# Patient Record
Sex: Male | Born: 1970 | Race: White | Hispanic: No | Marital: Single
Health system: Midwestern US, Community
[De-identification: ages and names within clinical notes are randomized; demographics above are authoritative.]

## PROBLEM LIST (undated history)

## (undated) DIAGNOSIS — F988 Other specified behavioral and emotional disorders with onset usually occurring in childhood and adolescence: Secondary | ICD-10-CM

## (undated) HISTORY — DX: Other specified behavioral and emotional disorders with onset usually occurring in childhood and adolescence: F98.8

## (undated) HISTORY — PX: KNEE ARTHROSCOPY WITH DRILLING/MICROFRACTURE: SHX6425

## (undated) HISTORY — PX: APPENDECTOMY: SHX54

---

## 2011-12-05 ENCOUNTER — Inpatient Hospital Stay

## 2011-12-05 ENCOUNTER — Ambulatory Visit: Admit: 2011-12-05

## 2011-12-19 ENCOUNTER — Inpatient Hospital Stay: Admit: 2011-12-19

## 2011-12-19 ENCOUNTER — Ambulatory Visit: Admit: 2011-12-19

## 2012-01-13 ENCOUNTER — Inpatient Hospital Stay: Admit: 2012-01-13

## 2012-01-13 ENCOUNTER — Ambulatory Visit: Admit: 2012-01-13

## 2012-01-29 ENCOUNTER — Inpatient Hospital Stay: Admit: 2012-01-31

## 2012-02-29 ENCOUNTER — Inpatient Hospital Stay: Admit: 2012-03-02

## 2012-03-09 ENCOUNTER — Ambulatory Visit

## 2012-03-27 ENCOUNTER — Encounter

## 2012-03-28 ENCOUNTER — Inpatient Hospital Stay: Admit: 2012-03-30

## 2013-02-25 ENCOUNTER — Ambulatory Visit: Admit: 2013-02-25

## 2013-03-16 ENCOUNTER — Encounter: Attending: Nurse Practitioner

## 2013-03-16 ENCOUNTER — Encounter

## 2013-03-16 NOTE — ED Provider Notes (Signed)
Triage Chief Complaint:   Laceration    HOPI:  Peter Cooley is a 43 y.o. male that presents today complaining of hand laceration.  Context is patient was playing volleyball and reached for the volleyball and accidentally hit someone in the tooth.  Call in the tooth did lacerate his knuckle.  Did happened approximately 30 minutes prior to arrival    No.  Tetanus shot is not up-to-date.  Patient ranks pain 1 out of 10.  Nonradiating.  Denies any numbness or tingling of the fingers denies any decreased range of motion.  ROS:  At least 04 systems reviewed and otherwise negative except as in the [place].    Past Medical History   Diagnosis Date   ??? ADD (attention deficit disorder)      Past Surgical History   Procedure Laterality Date   ??? Shoulder arthroscopy     ??? Knee surgery     ??? Appendectomy       History reviewed. No pertinent family history.  History     Social History   ??? Marital Status: Single     Spouse Name: N/A     Number of Children: N/A   ??? Years of Education: N/A     Occupational History   ??? Not on file.     Social History Main Topics   ??? Smoking status: Never Smoker    ??? Smokeless tobacco: Current User     Types: Snuff   ??? Alcohol Use: Yes      Comment: occasionally   ??? Drug Use: No   ??? Sexual Activity:     Partners: Female     Other Topics Concern   ??? Not on file     Social History Narrative     Current Facility-Administered Medications   Medication Dose Route Frequency Provider Last Rate Last Dose   ??? amoxicillin-clavulanate (AUGMENTIN) 875-125 MG per tablet 1 tablet  1 tablet Oral Once Ambrose Pancoast, PA-C         Current Outpatient Prescriptions   Medication Sig Dispense Refill   ??? amphetamine-dextroamphetamine (ADDERALL) 20 MG tablet Take 20 mg by mouth daily.       ??? amoxicillin-clavulanate (AUGMENTIN) 875-125 MG per tablet Take 1 tablet by mouth 2 times daily for 10 days.  20 tablet  0     No Known Allergies    Nursing Notes Reviewed    Physical Exam:  ED Triage Vitals   Enc Vitals Group       BP 03/16/13 2155 155/85 mmHg      Pulse 03/16/13 2155 83      Resp 03/16/13 2155 16      Temp 03/16/13 2155 98.4 ??F (36.9 ??C)      Temp Source 03/16/13 2155 Oral      SpO2 03/16/13 2155 100 %      Weight 03/16/13 2155 205 lb (92.987 kg)      Height 03/16/13 2155 6' 5"  (1.956 m)      Head Cir --       Peak Flow --       Pain Score --       Pain Loc --       Pain Edu? --       Excl. in [place]? --      GENERAL APPEARANCE: Awake and alert. Cooperative. No acute distress.  HEAD: Normocephalic. Atraumatic.  MUSCULOSKELETAL: No acute deformities.  SKIN: Warm and dry. No rash, No erythema, No edema. No  ecchymoses.   L shaped laceration just above 3rd mcp on volar aspect of left hand. Measuring 1.5cmx1.5cm. Actively bleeding non gaping. No FB noted. No surrounding erythema, no discharge.  No tendonous soft tissue appreciable  Range of motion of all digits is including flexion and extension of digits 2 through 5 opposition abduction and adduction deduction of digit 1.  Capillary refill less than 2 seconds.  Sharp and dull sensation intact.    NEUROLOGICAL: No gross facial drooping. Moves all 4 extremities spontaneously.  PSYCHIATRIC: Normal mood.    Procedure Note - Laceration repair:  The risks and benefits of laceration repair were discussed with the patient. Questions were sought and answered and verbal consent was given for the procedure. The area was prepped and draped in standard bedside fashion. The wound area was anesthetized with 60m of Lidocaine 2% with epinephrine without added sodium bicarbonate. The wound was explored with No foreign bodies found. The wound was repaired with 5-0 Prolene; 6 sutures were used. The patient tolerated the procedure well without complications and my repeat neurovascular exam post-procedure is unchanged.    Wound care and scar minimization education was provided. Instructions were given to return for increasing pain, redness, streaking, discharge, or any other worsening or worrisome  concerns.  I have reviewed and interpreted all of the currently available lab results from this visit (if applicable):  No results found for this visit on 03/16/13.   Radiographs (if obtained):  []  The following radiograph was interpreted by myself in the absence of a radiologist:   []  Radiologist's Report Reviewed:       EKG (if obtained):   Please See Note of attending physician for EKG interpretation.     Chart review shows recent radiograph(s):  No results found.    MDM:   Neurovascularly intact.    Tdap updated.    Laceration repair performed by myself without complications.    Pt is to be discharged home. Pt is  to return immediately to the emergency department if he has any new, worrisome or worsening symptoms. Pt is to follow up with PCP within 2 days.  Patient/Surrogate vocalizes agreement and understanding with this plan and he has no questions upon disposition.  Pt is comfortable upon disposition home. Patient is stable, Patients vital signs are stable.    Vital signs and nursing notes reviewed during ED course. I independently managed patient today in the ED.     Attending physician Dr. GElder Cyphers Was available for consultation for entirety of patient stay.       All pertinent Lab data and radiographic results reviewed with patient at bedside.   The patient and/or the family were informed of the results of any tests/labs/imaging, the treatment plan, and time was allotted to answer questions.   See chart for details of medications given during the ED stay.    BP 155/85    Pulse 83    Temp(Src) 98.4 ??F (36.9 ??C) (Oral)    Resp 16    Ht 6' 5"  (1.956 m)    Wt 205 lb (92.987 kg)    BMI 24.30 kg/m2      SpO2 100%       Clinical Impression:  1. Laceration of hand        Disposition referral (if applicable):  [address] [address] [address] [place] [address] [phone]   In 2 days      TBeatris Ship MD  [phone]  [place] [address]  [phone]    In 1 week  As  needed    Disposition medications (if applicable):  New Prescriptions    AMOXICILLIN-CLAVULANATE (AUGMENTIN) 875-125 MG PER TABLET    Take 1 tablet by mouth 2 times daily for 10 days.       (Please note that portions of this note may have been completed with a voice recognition program. Efforts were made to edit the dictations but occasionally words are mis-transcribed.)    Ambrose Pancoast, PA-C             Ambrose Pancoast, [place]  03/16/13 2306

## 2013-03-16 NOTE — ED Notes (Signed)
Patient to ED with laceration to left hand. The patient states he injured hand during a volleyball game tonight after smacking hand against another player and their tooth lacerated his hand    Juliane Poot, RN  03/16/13 2204

## 2013-03-17 ENCOUNTER — Inpatient Hospital Stay: Admit: 2013-03-17 | Discharge: 2013-03-17

## 2013-03-17 MED ORDER — TETANUS-DIPHTH-ACELL PERTUSSIS 5-2-15.5 LF-MCG/0.5 IM SUSP
Freq: Once | INTRAMUSCULAR | Status: AC
Start: 2013-03-17 — End: 2013-03-16
  Administered 2013-03-17: 04:00:00 0.5 mL via INTRAMUSCULAR

## 2013-03-17 MED ORDER — AMOXICILLIN-POT CLAVULANATE 875-125 MG PO TABS
875-125 | ORAL_TABLET | Freq: Two times a day (BID) | ORAL | Status: AC
Start: 2013-03-17 — End: 2013-03-26

## 2013-03-17 MED ORDER — LIDOCAINE-EPINEPHRINE 2 %-1:100000 IJ SOLN
2 percent-1:100000 | Freq: Once | INTRAMUSCULAR | Status: AC
Start: 2013-03-17 — End: 2013-03-16
  Administered 2013-03-17: 04:00:00 20 mL

## 2013-03-17 MED ORDER — AMOXICILLIN-POT CLAVULANATE 875-125 MG PO TABS
875-125 MG | Freq: Once | ORAL | Status: AC
Start: 2013-03-17 — End: 2013-03-16
  Administered 2013-03-17: 04:00:00 1 via ORAL

## 2013-03-17 MED ORDER — BACITRACIN ZINC 500 UNIT/GM EX OINT
500 UNIT/GM | Freq: Once | CUTANEOUS | Status: AC
Start: 2013-03-17 — End: 2013-03-16
  Administered 2013-03-17: 04:00:00 via TOPICAL

## 2013-03-17 MED FILL — BACITRACIN ZINC 500 UNIT/GM EX OINT: 500 UNIT/GM | CUTANEOUS | Qty: 1

## 2013-03-17 MED FILL — AMOXICILLIN-POT CLAVULANATE 875-125 MG PO TABS: 875-125 MG | ORAL | Qty: 1

## 2013-03-17 MED FILL — LIDOCAINE-EPINEPHRINE 2 %-1:100000 IJ SOLN: 2 percent-1:100000 | INTRAMUSCULAR | Qty: 20

## 2013-03-17 MED FILL — ADACEL 5-2-15.5 LF-MCG/0.5 IM SUSP: INTRAMUSCULAR | Qty: 0.5

## 2013-03-23 ENCOUNTER — Ambulatory Visit: Admit: 2013-03-23

## 2013-03-23 ENCOUNTER — Ambulatory Visit: Admit: 2013-03-23 | Attending: Neurology | Primary: Family Medicine

## 2013-04-01 ENCOUNTER — Ambulatory Visit: Admit: 2013-04-01

## 2013-04-09 ENCOUNTER — Encounter

## 2015-03-09 DIAGNOSIS — J069 Acute upper respiratory infection, unspecified: Secondary | ICD-10-CM

## 2015-03-09 NOTE — ED Provider Notes (Signed)
Triage Chief Complaint:   Cough and Pharyngitis    HOPI:  Peter Cooley is a 45 y.o. male that presents with symptoms of uri that started 2 days ago. Symptoms include cough and congestion. Other associated symptoms include sore throat. Symptoms was described to be constant and moderate. Onset is gradual.       ROS:  General:  no weakness  Eyes:  no visual changes  ENT: positive for ENT symptoms as described in the HPI  Cardiovascular:  no chest pain  Respiratory: no difficulty breathing  Gastrointestinal:  no abdominal pain  Musculoskeletal:  no unilateral swelling  Skin:  no rash  Neurologic:  no headache  Psychiatric:  no anxiety  Genitourinary:  no incontinence  Endocrine:  no unexpected weight change    Past Medical History   Diagnosis Date   ??? ADD (attention deficit disorder)      Past Surgical History   Procedure Laterality Date   ??? Shoulder arthroscopy     ??? Knee surgery     ??? Appendectomy       History reviewed. No pertinent family history.  Social History     Social History   ??? Marital status: Single     Spouse name: N/A   ??? Number of children: N/A   ??? Years of education: N/A     Occupational History   ??? Not on file.     Social History Main Topics   ??? Smoking status: Never Smoker   ??? Smokeless tobacco: Current User     Types: Snuff   ??? Alcohol use Yes      Comment: occasionally   ??? Drug use: No   ??? Sexual activity: Yes     Partners: Female     Other Topics Concern   ??? Not on file     Social History Narrative     Current Facility-Administered Medications   Medication Dose Route Frequency Provider Last Rate Last Dose   ??? dexamethasone (DECADRON) tablet 8 mg  8 mg Oral Once Cathe Mons, MD         Current Outpatient Prescriptions   Medication Sig Dispense Refill   ??? albuterol sulfate HFA (PROVENTIL HFA) 108 (90 BASE) MCG/ACT inhaler Inhale 2 puffs into the lungs every 4 hours as needed for Wheezing or Shortness of Breath With spacer (and mask if indicated). Thanks. 1 Inhaler 1   ???  amphetamine-dextroamphetamine (ADDERALL) 20 MG tablet Take 20 mg by mouth daily.       No Known Allergies    Nursing Notes Reviewed    Physical Exam:  ED Triage Vitals   Enc Vitals Group      BP 03/09/15 2012 119/70      Pulse 03/09/15 2012 79      Resp 03/09/15 2012 17      Temp 03/09/15 2012 98.3 ??F (36.8 ??C)      Temp Source 03/09/15 2012 Oral      SpO2 03/09/15 2012 96 %      Weight 03/09/15 2012 205 lb (93 kg)      Height 03/09/15 2012 6' 5"  (1.956 m)      Head Cir --       Peak Flow --       Pain Score --       Pain Loc --       Pain Edu? --       Excl. in [place]? --        My  pulse ox interpretation is ??? normal  Constitutional: normally developed.  Eyes: Conjunctiva pink, Pupils 2+ ,   Ears, nose, mouth and throat:  Oral mucosa moist, tympanic membranes without evidence of otitis media, posterior pharynx with erythema but no exudate, nasal congestion noted   Cardiac: Normal heart sounds, bilateral palpable radial pulses   Respiratory:  Respirations nonlabored. End-expiratory wheezes noted, good air entry throughout, no tachypnea    Gastrointestinal: Abdomen soft, and non-tender, no palpable hepatosplenomegaly   Musculoskeletal: No gross deformity in upper and lower extremity, appropriate active range of motion in all extremities.  Skin: warm, dry  Neuro: Alert and oriented to time, place and person. No focal neuro deficits  Psychiatric: normal affect.     I have reviewed and interpreted all of the currently available lab results from this visit (if applicable):  No results found for this visit on 03/09/15.   Radiographs (if obtained):  []  The following radiograph was interpreted by myself in the absence of a radiologist:   []  Radiologist's Report Reviewed:  No orders to display         EKG (if obtained): (All EKG's are interpreted by myself in the absence of a cardiologist)    Chart review shows recent radiographs:  No results found.    MDM:  Patient presents with an uncomplicated upper respiratory infection with  mild symptoms, without signs of spread beyond the sinus cavity and without severe comorbidity. Therefore, we can manage the patient's symptoms as an outpatient with treatments that target their symptoms.??I have given the patient instructions to followup with their doctor in the next 2-3 days. They have been asked to return to the ED should they develop worsening symptoms.??    Re-evaluation:  [x]  Not Applicable  []  The patient was re-evaluated after a period of observation and treatment. Repeat exam noted improvement in clinical status and improvement of their symptoms. Repeat vital signs are stable. Therefore, no further diagnostic evaluation is indicated at this time.    Clinical Impression:  1. Acute upper respiratory infection      Disposition referral (if applicable):  Hass[facility] Neth  [place]  [place] [address]  [phone]    Schedule an appointment as soon as possible for a visit in 3 days  If symptoms don't improve    Disposition medications (if applicable):  New Prescriptions    ALBUTEROL SULFATE HFA (PROVENTIL HFA) 108 (90 BASE) MCG/ACT INHALER    Inhale 2 puffs into the lungs every 4 hours as needed for Wheezing or Shortness of Breath With spacer (and mask if indicated). Thanks.       Comment: Please note this report has been produced using speech recognition software and may contain errors related to that system including errors in grammar, punctuation, and spelling, as well as words and phrases that may be inappropriate. If there are any questions or concerns please feel free to contact the dictating provider for clarification.         Cathe Mons, MD  03/09/15 2031

## 2015-03-09 NOTE — ED Triage Notes (Signed)
Patient c/o cold symptoms that began earlier this week. Has burning in his chest with cough. States he is having yellowish-gray phlegm.

## 2015-03-10 ENCOUNTER — Inpatient Hospital Stay: Admit: 2015-03-10 | Discharge: 2015-03-10 | Attending: Emergency Medicine

## 2015-03-10 MED ORDER — ALBUTEROL SULFATE HFA 108 (90 BASE) MCG/ACT IN AERS
108 (90 Base) MCG/ACT | RESPIRATORY_TRACT | 1 refills | Status: AC | PRN
Start: 2015-03-10 — End: 2015-04-08

## 2015-03-10 MED ORDER — DEXAMETHASONE 4 MG PO TABS
4 MG | Freq: Once | ORAL | Status: AC
Start: 2015-03-10 — End: 2015-03-09
  Administered 2015-03-10: 02:00:00 8 mg via ORAL

## 2015-03-10 MED FILL — DEXAMETHASONE 4 MG PO TABS: 4 MG | ORAL | Qty: 2

## 2017-03-03 ENCOUNTER — Encounter: Payer: Self-pay | Admitting: *Deleted

## 2017-03-19 ENCOUNTER — Encounter: Payer: Self-pay | Admitting: Physician Assistant

## 2017-03-19 ENCOUNTER — Ambulatory Visit (INDEPENDENT_AMBULATORY_CARE_PROVIDER_SITE_OTHER): Payer: Commercial Managed Care - PPO | Admitting: Physician Assistant

## 2017-03-19 ENCOUNTER — Telehealth: Payer: Self-pay | Admitting: Physician Assistant

## 2017-03-19 VITALS — BP 112/60 | HR 68 | Temp 97.5°F | Resp 16 | Ht 77.0 in | Wt 232.8 lb

## 2017-03-19 DIAGNOSIS — Z7689 Persons encountering health services in other specified circumstances: Secondary | ICD-10-CM

## 2017-03-19 DIAGNOSIS — Z6826 Body mass index (BMI) 26.0-26.9, adult: Secondary | ICD-10-CM | POA: Insufficient documentation

## 2017-03-19 DIAGNOSIS — Z6827 Body mass index (BMI) 27.0-27.9, adult: Secondary | ICD-10-CM | POA: Diagnosis not present

## 2017-03-19 DIAGNOSIS — F900 Attention-deficit hyperactivity disorder, predominantly inattentive type: Secondary | ICD-10-CM | POA: Diagnosis not present

## 2017-03-19 MED ORDER — AMPHETAMINE-DEXTROAMPHETAMINE 20 MG PO TABS: 20.0000 mg | ORAL_TABLET | Freq: Two times a day (BID) | ORAL | 0 refills | Status: DC

## 2017-03-19 NOTE — Patient Instructions (Signed)

## 2017-03-19 NOTE — Telephone Encounter (Signed)
[facility] for Adderall 20 mg RX.  Please let patient know when auth. Has been completed.

## 2017-03-19 NOTE — Progress Notes (Signed)
Name: Mario Armstrong   MRN: [id number]    DOB: 06/24/1970   Date:03/19/2017       Progress Note  Subjective  Chief Complaint  Chief Complaint  Patient presents with  . New Patient (Initial Visit)  . Establish Care    HPI Patient here today to establish care. He also needs Adderall prescription.Reports he was diagnosed 7 years ago. Reports he was going to [facility] center.   He is establishing here from [place]. He moved to [facility] in November 2018 due to job change. 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  He is single and has no children. He did previously smoke e.cigarettes but quit approx 1 year ago. He does drink alcohol on occasion, once or twice per week.   No problem-specific Assessment & Plan notes found for this encounter.   Past Medical History:  Diagnosis Date  . ADD (attention deficit disorder)     Past Surgical History:  Procedure Laterality Date  . APPENDECTOMY    . KNEE ARTHROSCOPY WITH DRILLING/MICROFRACTURE      Family History  Problem Relation Age of Onset  . Cancer Father        prostate  . Cancer Maternal Uncle   . Cancer Paternal Aunt   . ALS Paternal Uncle     Social History   Socioeconomic History  . Marital status: Single    Spouse name: Not on file  . Number of children: Not on file  . Years of education: Not on file  . Highest education level: Not on file  Social Needs  . Financial resource strain: Not on file  . Food insecurity - worry: Not on file  . Food insecurity - inability: Not on file  . Transportation needs - medical: Not on file  . Transportation needs - non-medical: Not on file  Occupational History  . Not on file  Tobacco Use  . Smoking status: Former Smoker    Last attempt to quit: 01/30/2016    Years since quitting: 1.1  . Smokeless tobacco: Never Used  Substance and Sexual Activity  . Alcohol use: Yes    Alcohol/week: 0.6 - 1.2 oz    Types: 1 - 2 Cans of beer per week  . Drug use: No  . Sexual activity: Not  on file  Other Topics Concern  . Not on file  Social History Narrative  . Not on file    No current outpatient medications on file.  No Known Allergies   Review of Systems  Constitutional: Negative.        Activity change  HENT: Negative.   Eyes: Negative.   Respiratory: Negative.   Cardiovascular: Negative.   Gastrointestinal: Negative.   Genitourinary: Negative.   Musculoskeletal: Negative.   Skin: Negative.        Wound  Neurological: Negative.   Endo/Heme/Allergies: Negative.   Psychiatric/Behavioral: Negative.        Decreased Concentration    Objective  Vitals:   03/19/17 1524  BP: 112/60  Pulse: 68  Resp: 16  Temp: (!) 97.5 F (36.4 C)  TempSrc: Oral  Weight: 232 lb 12.8 oz (105.6 kg)  Height: 6' 5"  (1.956 m)    Physical Exam  Constitutional: He is oriented to person, place, and time and well-developed, well-nourished, and in no distress. No distress.  HENT:  Head: Normocephalic and atraumatic.  Eyes: Conjunctivae and EOM are normal. Pupils are equal, round, and reactive to light.  Neck: Normal range of motion. Neck  supple.  Cardiovascular: Normal rate, regular rhythm, normal heart sounds and intact distal pulses.  No murmur heard. Pulmonary/Chest: Effort normal and breath sounds normal. No respiratory distress.  Neurological: He is alert and oriented to person, place, and time.  Psychiatric: Mood, memory, affect and judgment normal.  Vitals reviewed.   Assessment & Plan  1. Establishing care with new doctor, encounter for Establishing from [facility] Records requested.   2. Attention deficit hyperactivity disorder (ADHD), predominantly inattentive type Will give one month refill while we await records. Once records received will fill 3 months at a time. Will schedule CPE once records received to see when his CPE is due.  - amphetamine-dextroamphetamine (ADDERALL) 20 MG tablet; Take 1 tablet (20 mg total) by mouth 2 (two)  times daily.  Dispense: 60 tablet; Refill: 0  3. BMI 27.0-27.9,adult Counseled patient on healthy lifestyle modifications including dieting and exercise.

## 2017-03-20 NOTE — Telephone Encounter (Signed)
Received PA form today will try to get this done between patients. Once I send this PA it takes 48-72 hours for response.  Thanks,  -Joseline

## 2017-03-20 NOTE — Telephone Encounter (Signed)
Advised patient as below. Patient reports that he may just pay for the medication out of pocket because he needs this to help him focus on his job. He reports that he will wait to hear from the insurance company next week.

## 2017-04-29 ENCOUNTER — Encounter: Payer: Self-pay | Admitting: Physician Assistant

## 2017-04-29 DIAGNOSIS — F900 Attention-deficit hyperactivity disorder, predominantly inattentive type: Secondary | ICD-10-CM

## 2017-04-30 ENCOUNTER — Encounter: Payer: Self-pay | Admitting: Physician Assistant

## 2017-04-30 MED ORDER — AMPHETAMINE-DEXTROAMPHETAMINE 20 MG PO TABS: 20.0000 mg | ORAL_TABLET | Freq: Two times a day (BID) | ORAL | 0 refills | Status: DC

## 2017-04-30 NOTE — Telephone Encounter (Signed)
Pharmacy called saying the rx is on back order.  [facility]  Thanks [employer]

## 2017-05-05 MED ORDER — AMPHETAMINE-DEXTROAMPHETAMINE 20 MG PO TABS: 20.0000 mg | ORAL_TABLET | Freq: Two times a day (BID) | ORAL | 0 refills | Status: DC

## 2017-05-05 NOTE — Addendum Note (Signed)
Addended by: Mar Daring on: 05/05/2017 03:32 PM   Modules accepted: Orders

## 2017-06-05 ENCOUNTER — Encounter: Payer: Self-pay | Admitting: Physician Assistant

## 2017-06-05 DIAGNOSIS — F900 Attention-deficit hyperactivity disorder, predominantly inattentive type: Secondary | ICD-10-CM

## 2017-06-05 MED ORDER — AMPHETAMINE-DEXTROAMPHETAMINE 20 MG PO TABS: 20.0000 mg | ORAL_TABLET | Freq: Two times a day (BID) | ORAL | 0 refills | Status: DC

## 2017-06-06 ENCOUNTER — Telehealth: Payer: Self-pay | Admitting: Physician Assistant

## 2017-06-06 DIAGNOSIS — F900 Attention-deficit hyperactivity disorder, predominantly inattentive type: Secondary | ICD-10-CM

## 2017-06-06 MED ORDER — AMPHETAMINE-DEXTROAMPHETAMINE 20 MG PO TABS: 20.0000 mg | ORAL_TABLET | Freq: Two times a day (BID) | ORAL | 0 refills | Status: DC

## 2017-06-06 NOTE — Telephone Encounter (Signed)
Resent

## 2017-06-06 NOTE — Telephone Encounter (Signed)
[facility] and it does not have a supervising physician on the script.  They would like it resent with physician info. She said as of yesterday all prescriptions will be denied from PA's with out supervising physicians  Their call back is [phone] if you need to call them  Thanks teri

## 2017-07-10 ENCOUNTER — Encounter: Payer: Self-pay | Admitting: Physician Assistant

## 2017-07-10 DIAGNOSIS — F900 Attention-deficit hyperactivity disorder, predominantly inattentive type: Secondary | ICD-10-CM

## 2017-07-10 MED ORDER — AMPHETAMINE-DEXTROAMPHETAMINE 20 MG PO TABS: 20.0000 mg | ORAL_TABLET | Freq: Two times a day (BID) | ORAL | 0 refills | Status: DC

## 2017-08-20 ENCOUNTER — Other Ambulatory Visit: Payer: Self-pay | Admitting: Physician Assistant

## 2017-08-20 DIAGNOSIS — F900 Attention-deficit hyperactivity disorder, predominantly inattentive type: Secondary | ICD-10-CM

## 2017-08-20 MED ORDER — AMPHETAMINE-DEXTROAMPHETAMINE 20 MG PO TABS: 20.0000 mg | ORAL_TABLET | Freq: Two times a day (BID) | ORAL | 0 refills | Status: DC

## 2017-08-20 NOTE — Telephone Encounter (Signed)
Pt calling to request a refill on the following medication. Thanks CC  amphetamine-dextroamphetamine (ADDERALL) 20 MG tablet

## 2017-09-23 ENCOUNTER — Other Ambulatory Visit: Payer: Self-pay | Admitting: Physician Assistant

## 2017-09-23 DIAGNOSIS — F900 Attention-deficit hyperactivity disorder, predominantly inattentive type: Secondary | ICD-10-CM

## 2017-09-23 MED ORDER — AMPHETAMINE-DEXTROAMPHETAMINE 20 MG PO TABS: 20.0000 mg | ORAL_TABLET | Freq: Two times a day (BID) | ORAL | 0 refills | Status: DC

## 2017-10-26 ENCOUNTER — Other Ambulatory Visit: Payer: Self-pay | Admitting: Physician Assistant

## 2017-10-26 DIAGNOSIS — F900 Attention-deficit hyperactivity disorder, predominantly inattentive type: Secondary | ICD-10-CM

## 2017-10-27 MED ORDER — AMPHETAMINE-DEXTROAMPHETAMINE 20 MG PO TABS: 20.0000 mg | ORAL_TABLET | Freq: Two times a day (BID) | ORAL | 0 refills | Status: DC

## 2017-11-26 ENCOUNTER — Other Ambulatory Visit: Payer: Self-pay | Admitting: Physician Assistant

## 2017-11-26 DIAGNOSIS — F900 Attention-deficit hyperactivity disorder, predominantly inattentive type: Secondary | ICD-10-CM

## 2017-11-27 ENCOUNTER — Encounter: Payer: Self-pay | Admitting: Physician Assistant

## 2017-11-27 MED ORDER — AMPHETAMINE-DEXTROAMPHETAMINE 20 MG PO TABS: 20.0000 mg | ORAL_TABLET | Freq: Two times a day (BID) | ORAL | 0 refills | Status: DC

## 2017-11-27 NOTE — Telephone Encounter (Signed)
Will give one week. Needs follow up for controlled substance visit.

## 2017-12-03 NOTE — Progress Notes (Addendum)
Patient: Mario Armstrong Male    DOB: 30-Jul-1970   47 y.o.   MRN: [id number] Visit Date: 12/05/2017  Today's Provider: Mar Daring, PA-C   Chief Complaint  Patient presents with  . Follow-up    ADHD   Subjective:    HPI Patient here for refills on his ADHD Medicine. Patient is stable on Adderall 20 mg.  Also reports injury to the PIP joint of the right ring finger one month ago. Was playing basketball and hit a wall, reports finger dislocated at PIP joint (was at 90 degree bend) and he popped it back in place and taped to another finger. He does have movement of the joint but reports it is tender to move. Also has continued swelling of the PIP joint.     No Known Allergies   Current Outpatient Medications:  .  amphetamine-dextroamphetamine (ADDERALL) 20 MG tablet, Take 1 tablet (20 mg total) by mouth 2 (two) times daily for 7 days., Disp: 14 tablet, Rfl: 0  Review of Systems  Constitutional: Negative.   Respiratory: Negative.   Cardiovascular: Negative.   Musculoskeletal: Positive for arthralgias and joint swelling.  Neurological: Negative.   Psychiatric/Behavioral: Negative.     Social History   Tobacco Use  . Smoking status: Former Smoker    Last attempt to quit: 01/30/2016    Years since quitting: 1.8  . Smokeless tobacco: Never Used  Substance Use Topics  . Alcohol use: Yes    Alcohol/week: 1.0 - 2.0 standard drinks    Types: 1 - 2 Cans of beer per week   Objective:   BP 118/60 (BP Location: Left Arm, Patient Position: Sitting, Cuff Size: Normal)   Pulse 66   Temp (!) 97.4 F (36.3 C) (Oral)   Resp 16   Wt 216 lb 6.4 oz (98.2 kg)   SpO2 97%   BMI 25.66 kg/m  Vitals:   12/05/17 1432  BP: 118/60  Pulse: 66  Resp: 16  Temp: (!) 97.4 F (36.3 C)  TempSrc: Oral  SpO2: 97%  Weight: 216 lb 6.4 oz (98.2 kg)     Physical Exam  Constitutional: He appears well-developed and well-nourished. No distress.  HENT:  Head: Normocephalic and  atraumatic.  Neck: Normal range of motion. Neck supple. No thyromegaly present.  Cardiovascular: Normal rate, regular rhythm and normal heart sounds. Exam reveals no gallop and no friction rub.  No murmur heard. Pulmonary/Chest: Effort normal and breath sounds normal. No respiratory distress. He has no wheezes. He has no rales.  Musculoskeletal:       Right hand: He exhibits tenderness, bony tenderness and swelling. He exhibits normal range of motion, normal two-point discrimination, normal capillary refill, no deformity and no laceration. Normal sensation noted. Normal strength noted.       Hands: Skin: Skin is warm and dry. Capillary refill takes less than 2 seconds. He is not diaphoretic.  Psychiatric: He has a normal mood and affect. His behavior is normal. Judgment and thought content normal.  Vitals reviewed.  CLINICAL DATA: Patient states he was playing basketball about 2 months ago when he hit a wall with his right hand causing his ring finger to dislocate at PIP joint where patient states he popped it back in place and taped it to another finger. Patient has swelling and discomfort near PIP joint of right ring finger. Patients finger taped into position for lateral view.  EXAM: RIGHT RING FINGER 2+V  COMPARISON: None.  FINDINGS: No  acute fracture.  Best seen on the lateral view, there is a triangular shaped projection of bone from the volar margin at the base of the middle phalanx, with subtle irregularity noted along the volar margin of the articular surface of the middle phalanx at the PIP joint. These findings are consistent with an avulsion fracture that has healed with residual deformity.  There is soft tissue swelling around the PIP joint, asymmetric, predominantly over the radial aspect.  IMPRESSION: 1. Findings consistent with a 17-monthold volar plate avulsion fracture at the base of the middle phalanx of the right ring finger, now healed with residual  deformity along the volar base of the middle phalanx, with persistent surrounding soft tissue swelling. There may be associated ligamentous injury.   Electronically Signed By: DLajean ManesM.D. On: 12/06/2017 08:57          Assessment & Plan:     1. Attention deficit hyperactivity disorder (ADHD), predominantly inattentive type Stable. Diagnosis pulled for medication refill. Continue current medical treatment plan. - amphetamine-dextroamphetamine (ADDERALL) 20 MG tablet; Take 1 tablet (20 mg total) by mouth 2 (two) times daily.  Dispense: 60 tablet; Refill: 0  2. Finger injury, right, initial encounter Will get imaging as below to r/o bony abnormality. May need referral to orthopedics for further evaluation.  - DG Finger Ring Right; Future       JMar Daring PA-C  [place]

## 2017-12-05 ENCOUNTER — Ambulatory Visit
Admission: RE | Admit: 2017-12-05 | Discharge: 2017-12-05 | Disposition: A | Discharge status: Home / Self Care | Payer: Commercial Managed Care - PPO | Source: Ambulatory Visit | Attending: Physician Assistant | Admitting: Physician Assistant

## 2017-12-05 ENCOUNTER — Ambulatory Visit (INDEPENDENT_AMBULATORY_CARE_PROVIDER_SITE_OTHER): Payer: Commercial Managed Care - PPO | Admitting: Physician Assistant

## 2017-12-05 ENCOUNTER — Encounter: Payer: Self-pay | Admitting: Physician Assistant

## 2017-12-05 VITALS — BP 118/60 | HR 66 | Temp 97.4°F | Resp 16 | Wt 216.4 lb

## 2017-12-05 DIAGNOSIS — W228XXA Striking against or struck by other objects, initial encounter: Secondary | ICD-10-CM | POA: Insufficient documentation

## 2017-12-05 DIAGNOSIS — F900 Attention-deficit hyperactivity disorder, predominantly inattentive type: Secondary | ICD-10-CM | POA: Diagnosis not present

## 2017-12-05 DIAGNOSIS — Y9367 Activity, basketball: Secondary | ICD-10-CM | POA: Insufficient documentation

## 2017-12-05 DIAGNOSIS — S6991XA Unspecified injury of right wrist, hand and finger(s), initial encounter: Secondary | ICD-10-CM

## 2017-12-05 MED ORDER — AMPHETAMINE-DEXTROAMPHETAMINE 20 MG PO TABS: 20.0000 mg | ORAL_TABLET | Freq: Two times a day (BID) | ORAL | 0 refills | Status: DC

## 2017-12-05 NOTE — Patient Instructions (Signed)
Finger or Thumb Dislocation Finger or thumb dislocation happens when two bones in your finger or thumb separate at a joint. Your doctor will move your bones back into place (reduction). This may be done by hand (manually) or with surgery. You may be given a splint to help you heal. Follow these instructions at home: If you have a splint:  Do not put pressure on any part of the splint until it is fully hardened. This may take several hours.  Wear it as told by your doctor. Remove it only as told by your doctor.  Loosen the splint if your fingers tingle, become numb, or turn cold and blue.  Do not let your splint get wet if it is not waterproof. ? Do not take baths, swim, or use a hot tub until your doctor approves. Ask your doctor if you can take showers. ? If you have a splint that is not waterproof, cover it with a watertight plastic bag when you take a bath or a shower.  Keep the splint clean. Managing pain, stiffness, and swelling  If directed, put ice on the injured area. ? Put ice in a plastic bag. ? Place a towel between your skin and the bag. ? Leave the ice on for 20 minutes, 2-3 times a day.  Move your fingers often to avoid stiffness and to lessen swelling.  Raise (elevate) the injured area above the level of your heart while you are sitting or lying down. Driving  Do not drive or use heavy machinery while taking prescription pain medicine.  Ask your doctor when it is safe to drive if you have a splint on your hand. Activity  Return to your normal activities as told by your doctor. Ask your doctor what activities are safe for you.  Rest and limit your hand movement as told by your doctor.  If you were told to do physical therapy, do exercises as told by your doctor. General instructions  Take over-the-counter and prescription medicines only as told by your doctor.  Do not use any tobacco products, such as cigarettes, chewing tobacco, and e-cigarettes. Tobacco can  delay bone healing. If you need help quitting, ask your doctor  Keep all follow-up visits as told by your doctor. This is important. Contact a health care provider if:  You have problems with your splint.  You have pain that gets worse or does not get better with medicine.  You have bruising, swelling, or redness that gets worse.  You have trouble moving your finger or thumb after it heals. Get help right away if:  You lose feeling in your finger or thumb.  You cannot move your finger or thumb.  Your finger or thumb is pale or cold.  You have very bad (severe) pain. This information is not intended to replace advice given to you by your health care provider. Make sure you discuss any questions you have with your health care provider. Document Released: 01/03/2011 Document Revised: 09/13/2015 Document Reviewed: 06/10/2014 Elsevier Interactive Patient Education  [employer].

## 2017-12-08 ENCOUNTER — Telehealth: Payer: Self-pay

## 2017-12-08 NOTE — Telephone Encounter (Signed)
Unable to leave a VM. No answer. Will try again later.

## 2017-12-08 NOTE — Addendum Note (Signed)
Addended by: Mar Daring on: 12/08/2017 07:58 AM   Modules accepted: Orders

## 2017-12-08 NOTE — Telephone Encounter (Signed)
-----   Message from Mar Daring, [place] sent at 12/08/2017  7:56 AM EST ----- So there was an old fracture to the middle joint of the ring finger. The fracture has now healed but there remains some soft tissue swelling associated. There may be ligament damage from the injury. I will refer you to Dr. Peggye Ley (local hand surgeon) for further evaluation.

## 2017-12-10 NOTE — Telephone Encounter (Signed)
Patient advised as directed below.

## 2018-01-05 ENCOUNTER — Other Ambulatory Visit: Payer: Self-pay | Admitting: Physician Assistant

## 2018-01-05 DIAGNOSIS — F900 Attention-deficit hyperactivity disorder, predominantly inattentive type: Secondary | ICD-10-CM

## 2018-01-05 MED ORDER — AMPHETAMINE-DEXTROAMPHETAMINE 20 MG PO TABS: 20.0000 mg | ORAL_TABLET | Freq: Two times a day (BID) | ORAL | 0 refills | Status: DC

## 2018-02-06 ENCOUNTER — Other Ambulatory Visit: Payer: Self-pay | Admitting: Physician Assistant

## 2018-02-06 DIAGNOSIS — F900 Attention-deficit hyperactivity disorder, predominantly inattentive type: Secondary | ICD-10-CM

## 2018-02-06 MED ORDER — AMPHETAMINE-DEXTROAMPHETAMINE 20 MG PO TABS: 20.0000 mg | ORAL_TABLET | Freq: Two times a day (BID) | ORAL | 0 refills | Status: DC

## 2018-03-10 ENCOUNTER — Other Ambulatory Visit: Payer: Self-pay | Admitting: Family Medicine

## 2018-03-10 DIAGNOSIS — F900 Attention-deficit hyperactivity disorder, predominantly inattentive type: Secondary | ICD-10-CM

## 2018-03-11 MED ORDER — AMPHETAMINE-DEXTROAMPHETAMINE 20 MG PO TABS: 20.0000 mg | ORAL_TABLET | Freq: Two times a day (BID) | ORAL | 0 refills | Status: DC

## 2018-04-09 ENCOUNTER — Other Ambulatory Visit: Payer: Self-pay | Admitting: Physician Assistant

## 2018-04-09 DIAGNOSIS — F900 Attention-deficit hyperactivity disorder, predominantly inattentive type: Secondary | ICD-10-CM

## 2018-04-09 MED ORDER — AMPHETAMINE-DEXTROAMPHETAMINE 20 MG PO TABS: 20.0000 mg | ORAL_TABLET | Freq: Two times a day (BID) | ORAL | 0 refills | Status: DC

## 2018-04-25 ENCOUNTER — Other Ambulatory Visit: Payer: Self-pay | Admitting: Physician Assistant

## 2018-04-25 DIAGNOSIS — F900 Attention-deficit hyperactivity disorder, predominantly inattentive type: Secondary | ICD-10-CM

## 2018-04-27 MED ORDER — AMPHETAMINE-DEXTROAMPHETAMINE 20 MG PO TABS: 20.0000 mg | ORAL_TABLET | Freq: Two times a day (BID) | ORAL | 0 refills | Status: DC

## 2018-06-04 NOTE — Progress Notes (Signed)
Patient: Mario Armstrong, Male    DOB: 1970-12-25, 48 y.o.   MRN: [id number] Visit Date: 06/05/2018  Today's Provider: Mar Daring, PA-C   Chief Complaint  Patient presents with  . Annual Exam   Subjective:     Annual physical exam Mario Armstrong is a 48 y.o. male who presents today for health maintenance and complete physical. He feels well. He reports exercising. He reports he is sleeping well.  ----------------------------------------------------------------- Patient also here to follow-up on ADHD.Patient stable on Adderall 69m  Review of Systems  Constitutional: Negative.   HENT: Negative.   Eyes: Negative.   Respiratory: Negative.   Cardiovascular: Negative.   Gastrointestinal: Negative.   Endocrine: Negative.   Genitourinary: Negative.   Musculoskeletal: Negative.   Skin: Negative.   Allergic/Immunologic: Negative.   Neurological: Negative.   Hematological: Negative.   Psychiatric/Behavioral: Negative.     Social History      He  reports that he quit smoking about 2 years ago. He has never used smokeless tobacco. He reports current alcohol use of about 1.0 - 2.0 standard drinks of alcohol per week. He reports that he does not use drugs.       Social History   Socioeconomic History  . Marital status: Single    Spouse name: Not on file  . Number of children: Not on file  . Years of education: Not on file  . Highest education level: Not on file  Occupational History  . Not on file  Social Needs  . Financial resource strain: Not on file  . Food insecurity:    Worry: Not on file    Inability: Not on file  . Transportation needs:    Medical: Not on file    Non-medical: Not on file  Tobacco Use  . Smoking status: Former Smoker    Last attempt to quit: 01/30/2016    Years since quitting: 2.3  . Smokeless tobacco: Never Used  Substance and Sexual Activity  . Alcohol use: Yes    Alcohol/week: 1.0 - 2.0 standard drinks    Types: 1 - 2 Cans of  beer per week  . Drug use: No  . Sexual activity: Not on file  Lifestyle  . Physical activity:    Days per week: Not on file    Minutes per session: Not on file  . Stress: Not on file  Relationships  . Social connections:    Talks on phone: Not on file    Gets together: Not on file    Attends religious service: Not on file    Active member of club or organization: Not on file    Attends meetings of clubs or organizations: Not on file    Relationship status: Not on file  Other Topics Concern  . Not on file  Social History Narrative  . Not on file    Past Medical History:  Diagnosis Date  . ADD (attention deficit disorder)      Patient Active Problem List   Diagnosis Date Noted  . Attention deficit hyperactivity disorder (ADHD), predominantly inattentive type 03/19/2017  . BMI 27.0-27.9,adult 03/19/2017    Past Surgical History:  Procedure Laterality Date  . APPENDECTOMY    . KNEE ARTHROSCOPY WITH DRILLING/MICROFRACTURE      Family History        Family Status  Relation Name Status  . Father  (Not Specified)  . Mat Uncle  (Not Specified)  . PEthlyn Daniels (Not Specified)  .  Annamarie Major  (Not Specified)        His family history includes ALS in his paternal uncle; Cancer in his father, maternal uncle, and paternal aunt.      No Known Allergies   Current Outpatient Medications:  .  amphetamine-dextroamphetamine (ADDERALL) 20 MG tablet, Take 1 tablet (20 mg total) by mouth 2 (two) times daily for 30 days., Disp: 60 tablet, Rfl: 0   Patient Care Team: Rubye Beach as PCP - General (Family Medicine)    Objective:    Vitals: BP 109/74 (BP Location: Left Arm, Patient Position: Sitting, Cuff Size: Large)   Pulse 76   Temp 97.8 F (36.6 C) (Oral)   Resp 16   Ht 6' 5"  (1.956 m)   Wt 225 lb 6.4 oz (102.2 kg)   BMI 26.73 kg/m    Vitals:   06/05/18 0807  BP: 109/74  Pulse: 76  Resp: 16  Temp: 97.8 F (36.6 C)  TempSrc: Oral  Weight: 225 lb 6.4  oz (102.2 kg)  Height: 6' 5"  (1.956 m)     Physical Exam Vitals signs reviewed.  Constitutional:      General: He is not in acute distress.    Appearance: Normal appearance. He is well-developed and normal weight. He is not ill-appearing.  HENT:     Head: Normocephalic and atraumatic.     Right Ear: Tympanic membrane, ear canal and external ear normal.     Left Ear: Tympanic membrane, ear canal and external ear normal.     Nose: Nose normal.     Mouth/Throat:     Mouth: Mucous membranes are moist.     Pharynx: Oropharynx is clear.  Eyes:     General:        Right eye: No discharge.     Extraocular Movements: Extraocular movements intact.     Conjunctiva/sclera: Conjunctivae normal.     Pupils: Pupils are equal, round, and reactive to light.  Neck:     Musculoskeletal: Normal range of motion and neck supple.     Thyroid: No thyromegaly.     Trachea: No tracheal deviation.  Cardiovascular:     Rate and Rhythm: Normal rate and regular rhythm.     Pulses: Normal pulses.     Heart sounds: Normal heart sounds. No murmur.  Pulmonary:     Effort: Pulmonary effort is normal. No respiratory distress.     Breath sounds: Normal breath sounds. No wheezing or rales.  Chest:     Chest wall: No tenderness.  Abdominal:     General: Bowel sounds are normal. There is no distension.     Palpations: Abdomen is soft. There is no mass.     Tenderness: There is no abdominal tenderness. There is no guarding or rebound.  Musculoskeletal: Normal range of motion.        General: No tenderness.  Lymphadenopathy:     Cervical: No cervical adenopathy.  Skin:    General: Skin is warm and dry.     Capillary Refill: Capillary refill takes less than 2 seconds.     Findings: No erythema or rash.  Neurological:     General: No focal deficit present.     Mental Status: He is alert and oriented to person, place, and time. Mental status is at baseline.     Cranial Nerves: No cranial nerve deficit.      Motor: No abnormal muscle tone.     Coordination: Coordination normal.  Deep Tendon Reflexes: Reflexes are normal and symmetric. Reflexes normal.  Psychiatric:        Mood and Affect: Mood normal.        Behavior: Behavior normal.        Thought Content: Thought content normal.        Judgment: Judgment normal.      Depression Screen PHQ 2/9 Scores 03/19/2017  PHQ - 2 Score 0       Assessment & Plan:     Routine Health Maintenance and Physical Exam  Exercise Activities and Dietary recommendations Goals   None      There is no immunization history on file for this patient.  Health Maintenance  Topic Date Due  . HIV Screening  01/20/1987  . TETANUS/TDAP  01/20/1991  . INFLUENZA VACCINE  08/29/2018     Discussed health benefits of physical activity, and encouraged him to engage in regular exercise appropriate for his age and condition.    1. Annual physical exam Normal physical exam today. Will check labs as below and f/u pending lab results. If labs are stable and WNL he will not need to have these rechecked for one year at his next annual physical exam. He is to call the office in the meantime if he has any acute issue, questions or concerns. - CBC with Differential/Platelet - Comprehensive metabolic panel - Hemoglobin A1c - Lipid panel - TSH  2. Attention deficit hyperactivity disorder (ADHD), predominantly inattentive type Stable. Diagnosis pulled for medication refill. Continue current medical treatment plan. - amphetamine-dextroamphetamine (ADDERALL) 20 MG tablet; Take 1 tablet (20 mg total) by mouth 2 (two) times daily for 30 days.  Dispense: 60 tablet; Refill: 0  3. Prostate cancer screening Will check labs as below and f/u pending results. - PSA  4. Thyroid disorder screen Will check labs as below and f/u pending results. - TSH  5. Colon cancer screening Patient has never had colon cancer screening. Will do cologuard as below.  - Cologuard   6. Diabetes mellitus screening Will check labs as below and f/u pending results. - Hemoglobin A1c  7. Screening for HIV without presence of risk factors Will check labs as below and f/u pending results. - HIV antibody (with reflex)  8. Need for Tdap vaccination Tdap Vaccine given to patient without complications. Patient sat for 15 minutes after administration and was tolerated well without adverse effects. - Tdap vaccine greater than or equal to 7yo IM  --------------------------------------------------------------------    Mar Daring, PA-C  [facility]

## 2018-06-05 ENCOUNTER — Other Ambulatory Visit: Payer: Self-pay

## 2018-06-05 ENCOUNTER — Ambulatory Visit (INDEPENDENT_AMBULATORY_CARE_PROVIDER_SITE_OTHER): Payer: Commercial Managed Care - PPO | Admitting: Physician Assistant

## 2018-06-05 ENCOUNTER — Encounter: Payer: Self-pay | Admitting: Physician Assistant

## 2018-06-05 VITALS — BP 109/74 | HR 76 | Temp 97.8°F | Resp 16 | Ht 77.0 in | Wt 225.4 lb

## 2018-06-05 DIAGNOSIS — Z114 Encounter for screening for human immunodeficiency virus [HIV]: Secondary | ICD-10-CM

## 2018-06-05 DIAGNOSIS — Z1329 Encounter for screening for other suspected endocrine disorder: Secondary | ICD-10-CM

## 2018-06-05 DIAGNOSIS — Z0000 Encounter for general adult medical examination without abnormal findings: Secondary | ICD-10-CM | POA: Diagnosis not present

## 2018-06-05 DIAGNOSIS — F900 Attention-deficit hyperactivity disorder, predominantly inattentive type: Secondary | ICD-10-CM | POA: Diagnosis not present

## 2018-06-05 DIAGNOSIS — Z1211 Encounter for screening for malignant neoplasm of colon: Secondary | ICD-10-CM

## 2018-06-05 DIAGNOSIS — Z125 Encounter for screening for malignant neoplasm of prostate: Secondary | ICD-10-CM

## 2018-06-05 DIAGNOSIS — Z23 Encounter for immunization: Secondary | ICD-10-CM

## 2018-06-05 DIAGNOSIS — Z131 Encounter for screening for diabetes mellitus: Secondary | ICD-10-CM

## 2018-06-05 MED ORDER — AMPHETAMINE-DEXTROAMPHETAMINE 20 MG PO TABS: 20.0000 mg | ORAL_TABLET | Freq: Two times a day (BID) | ORAL | 0 refills | Status: DC

## 2018-06-06 LAB — COMPREHENSIVE METABOLIC PANEL
ALT: 22 IU/L (ref 0–44)
AST: 24 IU/L (ref 0–40)
Albumin/Globulin Ratio: 2 (ref 1.2–2.2)
Albumin: 4.6 g/dL (ref 4.0–5.0)
Alkaline Phosphatase: 72 IU/L (ref 39–117)
BUN/Creatinine Ratio: 11 (ref 9–20)
BUN: 13 mg/dL (ref 6–24)
Bilirubin Total: 0.6 mg/dL (ref 0.0–1.2)
CO2: 21 mmol/L (ref 20–29)
Calcium: 9.8 mg/dL (ref 8.7–10.2)
Chloride: 103 mmol/L (ref 96–106)
Creatinine, Ser: 1.16 mg/dL (ref 0.76–1.27)
GFR calc Af Amer: 87 mL/min/{1.73_m2} (ref >59)
GFR calc non Af Amer: 75 mL/min/{1.73_m2} (ref >59)
Globulin, Total: 2.3 g/dL (ref 1.5–4.5)
Glucose: 96 mg/dL (ref 65–99)
Potassium: 4.2 mmol/L (ref 3.5–5.2)
Sodium: 142 mmol/L (ref 134–144)
Total Protein: 6.9 g/dL (ref 6.0–8.5)

## 2018-06-06 LAB — CBC WITH DIFFERENTIAL/PLATELET
Basophils Absolute: 0 10*3/uL (ref 0.0–0.2)
Basos: 1 %
EOS (ABSOLUTE): 0.1 10*3/uL (ref 0.0–0.4)
Eos: 2 %
Hematocrit: 44.7 % (ref 37.5–51.0)
Hemoglobin: 15.8 g/dL (ref 13.0–17.7)
Immature Grans (Abs): 0 10*3/uL (ref 0.0–0.1)
Immature Granulocytes: 0 %
Lymphocytes Absolute: 1.6 10*3/uL (ref 0.7–3.1)
Lymphs: 36 %
MCH: 32.3 pg (ref 26.6–33.0)
MCHC: 35.3 g/dL (ref 31.5–35.7)
MCV: 91 fL (ref 79–97)
Monocytes Absolute: 0.5 10*3/uL (ref 0.1–0.9)
Monocytes: 12 %
Neutrophils Absolute: 2.3 10*3/uL (ref 1.4–7.0)
Neutrophils: 49 %
Platelets: 203 10*3/uL (ref 150–450)
RBC: 4.89 x10E6/uL (ref 4.14–5.80)
RDW: 12.4 % (ref 11.6–15.4)
WBC: 4.6 10*3/uL (ref 3.4–10.8)

## 2018-06-06 LAB — HEMOGLOBIN A1C
Est. average glucose Bld gHb Est-mCnc: 100 mg/dL
Hgb A1c MFr Bld: 5.1 % (ref 4.8–5.6)

## 2018-06-06 LAB — PSA: Prostate Specific Ag, Serum: 0.3 ng/mL (ref 0.0–4.0)

## 2018-06-06 LAB — LIPID PANEL
Chol/HDL Ratio: 3.2 ratio (ref 0.0–5.0)
Cholesterol, Total: 192 mg/dL (ref 100–199)
HDL: 60 mg/dL (ref >39)
LDL Calculated: 119 mg/dL — ABNORMAL HIGH (ref 0–99)
Triglycerides: 66 mg/dL (ref 0–149)
VLDL Cholesterol Cal: 13 mg/dL (ref 5–40)

## 2018-06-06 LAB — HIV ANTIBODY (ROUTINE TESTING W REFLEX): HIV Screen 4th Generation wRfx: NONREACTIVE

## 2018-06-06 LAB — TSH: TSH: 1.49 u[IU]/mL (ref 0.450–4.500)

## 2018-06-08 ENCOUNTER — Telehealth: Payer: Self-pay

## 2018-06-08 NOTE — Telephone Encounter (Signed)
Faxed cologuard order to [facility] with patient's demographic and insurance information

## 2018-06-23 ENCOUNTER — Encounter: Payer: Self-pay | Admitting: Physician Assistant

## 2018-06-27 LAB — COLOGUARD: Cologuard: NEGATIVE

## 2018-07-06 ENCOUNTER — Other Ambulatory Visit: Payer: Self-pay | Admitting: Physician Assistant

## 2018-07-06 DIAGNOSIS — F900 Attention-deficit hyperactivity disorder, predominantly inattentive type: Secondary | ICD-10-CM

## 2018-07-06 MED ORDER — AMPHETAMINE-DEXTROAMPHETAMINE 20 MG PO TABS: 20.0000 mg | ORAL_TABLET | Freq: Two times a day (BID) | ORAL | 0 refills | Status: DC

## 2018-08-05 ENCOUNTER — Other Ambulatory Visit: Payer: Self-pay | Admitting: Physician Assistant

## 2018-08-05 DIAGNOSIS — F900 Attention-deficit hyperactivity disorder, predominantly inattentive type: Secondary | ICD-10-CM

## 2018-08-05 MED ORDER — AMPHETAMINE-DEXTROAMPHETAMINE 20 MG PO TABS: 20.0000 mg | ORAL_TABLET | Freq: Two times a day (BID) | ORAL | 0 refills | Status: DC

## 2018-09-07 ENCOUNTER — Other Ambulatory Visit: Payer: Self-pay | Admitting: Physician Assistant

## 2018-09-07 DIAGNOSIS — F900 Attention-deficit hyperactivity disorder, predominantly inattentive type: Secondary | ICD-10-CM

## 2018-09-07 MED ORDER — AMPHETAMINE-DEXTROAMPHETAMINE 20 MG PO TABS: 20.0000 mg | ORAL_TABLET | Freq: Two times a day (BID) | ORAL | 0 refills | Status: DC

## 2018-10-09 ENCOUNTER — Other Ambulatory Visit: Payer: Self-pay | Admitting: Physician Assistant

## 2018-10-09 DIAGNOSIS — F900 Attention-deficit hyperactivity disorder, predominantly inattentive type: Secondary | ICD-10-CM

## 2018-10-09 MED ORDER — AMPHETAMINE-DEXTROAMPHETAMINE 20 MG PO TABS: 20.0000 mg | ORAL_TABLET | Freq: Two times a day (BID) | ORAL | 0 refills | Status: DC

## 2018-11-11 ENCOUNTER — Other Ambulatory Visit: Payer: Self-pay | Admitting: Physician Assistant

## 2018-11-11 DIAGNOSIS — F900 Attention-deficit hyperactivity disorder, predominantly inattentive type: Secondary | ICD-10-CM

## 2018-11-12 MED ORDER — AMPHETAMINE-DEXTROAMPHETAMINE 20 MG PO TABS: 20.0000 mg | ORAL_TABLET | Freq: Two times a day (BID) | ORAL | 0 refills | Status: DC

## 2018-12-14 ENCOUNTER — Other Ambulatory Visit: Payer: Self-pay | Admitting: Physician Assistant

## 2018-12-14 DIAGNOSIS — F900 Attention-deficit hyperactivity disorder, predominantly inattentive type: Secondary | ICD-10-CM

## 2018-12-14 MED ORDER — AMPHETAMINE-DEXTROAMPHETAMINE 20 MG PO TABS: 20.0000 mg | ORAL_TABLET | Freq: Two times a day (BID) | ORAL | 0 refills | Status: DC

## 2019-01-12 ENCOUNTER — Other Ambulatory Visit: Payer: Self-pay | Admitting: Physician Assistant

## 2019-01-12 DIAGNOSIS — F900 Attention-deficit hyperactivity disorder, predominantly inattentive type: Secondary | ICD-10-CM

## 2019-01-13 MED ORDER — AMPHETAMINE-DEXTROAMPHETAMINE 20 MG PO TABS: 20.0000 mg | ORAL_TABLET | Freq: Two times a day (BID) | ORAL | 0 refills | Status: DC

## 2019-02-15 ENCOUNTER — Other Ambulatory Visit: Payer: Self-pay | Admitting: Physician Assistant

## 2019-02-15 DIAGNOSIS — F900 Attention-deficit hyperactivity disorder, predominantly inattentive type: Secondary | ICD-10-CM

## 2019-02-15 MED ORDER — AMPHETAMINE-DEXTROAMPHETAMINE 20 MG PO TABS: 20.0000 mg | ORAL_TABLET | Freq: Two times a day (BID) | ORAL | 0 refills | Status: DC

## 2019-03-16 ENCOUNTER — Encounter: Payer: Self-pay | Admitting: Physician Assistant

## 2019-03-16 DIAGNOSIS — F900 Attention-deficit hyperactivity disorder, predominantly inattentive type: Secondary | ICD-10-CM

## 2019-03-16 MED ORDER — AMPHETAMINE-DEXTROAMPHETAMINE 20 MG PO TABS: 20.0000 mg | ORAL_TABLET | Freq: Two times a day (BID) | ORAL | 0 refills | Status: DC

## 2019-03-18 ENCOUNTER — Other Ambulatory Visit: Payer: Self-pay | Admitting: Physician Assistant

## 2019-03-18 DIAGNOSIS — F900 Attention-deficit hyperactivity disorder, predominantly inattentive type: Secondary | ICD-10-CM

## 2019-03-19 MED ORDER — AMPHETAMINE-DEXTROAMPHETAMINE 20 MG PO TABS: 20.0000 mg | ORAL_TABLET | Freq: Two times a day (BID) | ORAL | 0 refills | Status: DC

## 2019-04-15 ENCOUNTER — Other Ambulatory Visit: Payer: Self-pay | Admitting: Physician Assistant

## 2019-04-15 DIAGNOSIS — F900 Attention-deficit hyperactivity disorder, predominantly inattentive type: Secondary | ICD-10-CM

## 2019-04-16 MED ORDER — AMPHETAMINE-DEXTROAMPHETAMINE 20 MG PO TABS: 20.0000 mg | ORAL_TABLET | Freq: Two times a day (BID) | ORAL | 0 refills | Status: DC

## 2019-05-25 ENCOUNTER — Other Ambulatory Visit: Payer: Self-pay | Admitting: Physician Assistant

## 2019-05-25 DIAGNOSIS — F900 Attention-deficit hyperactivity disorder, predominantly inattentive type: Secondary | ICD-10-CM

## 2019-05-25 MED ORDER — AMPHETAMINE-DEXTROAMPHETAMINE 20 MG PO TABS: 20.0000 mg | ORAL_TABLET | Freq: Two times a day (BID) | ORAL | 0 refills | Status: DC

## 2019-06-23 ENCOUNTER — Other Ambulatory Visit: Payer: Self-pay | Admitting: Physician Assistant

## 2019-06-23 DIAGNOSIS — F900 Attention-deficit hyperactivity disorder, predominantly inattentive type: Secondary | ICD-10-CM

## 2019-06-24 MED ORDER — AMPHETAMINE-DEXTROAMPHETAMINE 20 MG PO TABS: 20.0000 mg | ORAL_TABLET | Freq: Two times a day (BID) | ORAL | 0 refills | Status: DC

## 2019-06-25 ENCOUNTER — Encounter: Payer: Self-pay | Admitting: Physician Assistant

## 2019-06-25 ENCOUNTER — Telehealth (INDEPENDENT_AMBULATORY_CARE_PROVIDER_SITE_OTHER): Payer: Commercial Managed Care - PPO | Admitting: Physician Assistant

## 2019-06-25 VITALS — Wt 240.0 lb

## 2019-06-25 DIAGNOSIS — H66002 Acute suppurative otitis media without spontaneous rupture of ear drum, left ear: Secondary | ICD-10-CM | POA: Diagnosis not present

## 2019-06-25 MED ORDER — NEOMYCIN-POLYMYXIN-HC 1 % OT SOLN: 3.0000 [drp] | Freq: Four times a day (QID) | OTIC | 0 refills | Status: DC

## 2019-06-25 NOTE — Progress Notes (Signed)
MyChart Video Visit    Virtual Visit via Video Note   This visit type was conducted due to national recommendations for restrictions regarding the COVID-19 Pandemic (e.g. social distancing) in an effort to limit this patient's exposure and mitigate transmission in our community. This patient is at least at moderate risk for complications without adequate follow up. This format is felt to be most appropriate for this patient at this time. Physical exam was limited by quality of the video and audio technology used for the visit.   Patient location: Home Provider location: [facility]   Patient: Mario Armstrong   DOB: 1970-03-17   49 y.o. Male  MRN: [id number] Visit Date: 06/25/2019  Today's healthcare provider: Mar Daring, PA-C   Chief Complaint  Patient presents with  . Otalgia   Subjective    Otalgia  There is pain in the left ear. This is a new problem. The current episode started in the past 7 days. The problem occurs every few hours. The problem has been unchanged. There has been no fever. The pain is mild. Pertinent negatives include no coughing, ear discharge, headaches, hearing loss, rhinorrhea, sore throat or vomiting. Treatments tried: Zyrtec. The treatment provided no relief.    Patient Active Problem List   Diagnosis Date Noted  . Attention deficit hyperactivity disorder (ADHD), predominantly inattentive type 03/19/2017  . BMI 26.0-26.9,adult 03/19/2017   Past Medical History:  Diagnosis Date  . ADD (attention deficit disorder)       Medications: Outpatient Medications Prior to Visit  Medication Sig  . amphetamine-dextroamphetamine (ADDERALL) 20 MG tablet Take 1 tablet (20 mg total) by mouth 2 (two) times daily.   No facility-administered medications prior to visit.    Review of Systems  Constitutional: Negative for appetite change, chills, fatigue and fever.  HENT: Positive for ear pain. Negative for congestion, ear discharge, hearing loss, rhinorrhea  and sore throat.   Respiratory: Negative for cough.   Cardiovascular: Negative.   Gastrointestinal: Negative for vomiting.  Neurological: Positive for dizziness. Negative for headaches.    Last CBC Lab Results  Component Value Date   WBC 4.6 06/05/2018   HGB 15.8 06/05/2018   HCT 44.7 06/05/2018   MCV 91 06/05/2018   MCH 32.3 06/05/2018   RDW 12.4 06/05/2018   PLT 203 92/11/9415   Last metabolic panel Lab Results  Component Value Date   GLUCOSE 96 06/05/2018   NA 142 06/05/2018   K 4.2 06/05/2018   CL 103 06/05/2018   CO2 21 06/05/2018   BUN 13 06/05/2018   CREATININE 1.16 06/05/2018   GFRNONAA 75 06/05/2018   GFRAA 87 06/05/2018   CALCIUM 9.8 06/05/2018   PROT 6.9 06/05/2018   ALBUMIN 4.6 06/05/2018   LABGLOB 2.3 06/05/2018   AGRATIO 2.0 06/05/2018   BILITOT 0.6 06/05/2018   ALKPHOS 72 06/05/2018   AST 24 06/05/2018   ALT 22 06/05/2018      Objective    Wt 240 lb (108.9 kg)   BMI 28.46 kg/m  BP Readings from Last 3 Encounters:  06/05/18 109/74  12/05/17 118/60  03/19/17 112/60   Wt Readings from Last 3 Encounters:  06/25/19 240 lb (108.9 kg)  06/05/18 225 lb 6.4 oz (102.2 kg)  12/05/17 216 lb 6.4 oz (98.2 kg)      Physical Exam Vitals reviewed.  Constitutional:      General: He is not in acute distress.    Appearance: He is well-developed. He is not ill-appearing.  HENT:  Head: Normocephalic and atraumatic.  Eyes:     Conjunctiva/sclera: Conjunctivae normal.  Pulmonary:     Effort: Pulmonary effort is normal. No respiratory distress.  Musculoskeletal:     Cervical back: Normal range of motion and neck supple.  Neurological:     Mental Status: He is alert.  Psychiatric:        Mood and Affect: Mood normal.        Behavior: Behavior normal.        Thought Content: Thought content normal.        Judgment: Judgment normal.       Assessment & Plan     1. Non-recurrent acute suppurative otitis media of left ear without spontaneous  rupture of tympanic membrane Suspect Otitis media vs ETD. Continue Zyrtec. Will give cortisporin drops as below. Call or mychart message if worsening. May require in person evaluation if not improving. - NEOMYCIN-POLYMYXIN-HYDROCORTISONE (CORTISPORIN) 1 % SOLN OTIC solution; Place 3 drops into the left ear 4 (four) times daily. X 5 - 7 days  Dispense: 10 mL; Refill: 0   No follow-ups on file.     I discussed the assessment and treatment plan with the patient. The patient was provided an opportunity to ask questions and all were answered. The patient agreed with the plan and demonstrated an understanding of the instructions.   The patient was advised to call back or seek an in-person evaluation if the symptoms worsen or if the condition fails to improve as anticipated.  I provided 8 minutes of non-face-to-face time during this encounter.  Reynolds Bowl, PA-C, have reviewed all documentation for this visit. The documentation on 06/25/19 for the exam, diagnosis, procedures, and orders are all accurate and complete.  Rubye Beach [facility] [phone] (phone) [phone] (fax)  [place]

## 2019-07-27 ENCOUNTER — Other Ambulatory Visit: Payer: Self-pay | Admitting: Physician Assistant

## 2019-07-27 DIAGNOSIS — F900 Attention-deficit hyperactivity disorder, predominantly inattentive type: Secondary | ICD-10-CM

## 2019-07-27 MED ORDER — AMPHETAMINE-DEXTROAMPHETAMINE 20 MG PO TABS: 20.0000 mg | ORAL_TABLET | Freq: Two times a day (BID) | ORAL | 0 refills | Status: DC

## 2019-08-25 ENCOUNTER — Other Ambulatory Visit: Payer: Self-pay | Admitting: Physician Assistant

## 2019-08-25 DIAGNOSIS — F900 Attention-deficit hyperactivity disorder, predominantly inattentive type: Secondary | ICD-10-CM

## 2019-08-26 MED ORDER — AMPHETAMINE-DEXTROAMPHETAMINE 20 MG PO TABS: 20.0000 mg | ORAL_TABLET | Freq: Two times a day (BID) | ORAL | 0 refills | Status: DC

## 2019-09-24 ENCOUNTER — Other Ambulatory Visit: Payer: Self-pay | Admitting: Physician Assistant

## 2019-09-24 DIAGNOSIS — F900 Attention-deficit hyperactivity disorder, predominantly inattentive type: Secondary | ICD-10-CM

## 2019-09-24 MED ORDER — AMPHETAMINE-DEXTROAMPHETAMINE 20 MG PO TABS: 20.0000 mg | ORAL_TABLET | Freq: Two times a day (BID) | ORAL | 0 refills | Status: DC

## 2019-10-22 ENCOUNTER — Other Ambulatory Visit: Payer: Self-pay | Admitting: Physician Assistant

## 2019-10-22 DIAGNOSIS — F900 Attention-deficit hyperactivity disorder, predominantly inattentive type: Secondary | ICD-10-CM

## 2019-10-22 MED ORDER — AMPHETAMINE-DEXTROAMPHETAMINE 20 MG PO TABS: 20.0000 mg | ORAL_TABLET | Freq: Two times a day (BID) | ORAL | 0 refills | Status: DC

## 2019-11-22 ENCOUNTER — Other Ambulatory Visit: Payer: Self-pay | Admitting: Physician Assistant

## 2019-11-22 DIAGNOSIS — F900 Attention-deficit hyperactivity disorder, predominantly inattentive type: Secondary | ICD-10-CM

## 2019-11-22 MED ORDER — AMPHETAMINE-DEXTROAMPHETAMINE 20 MG PO TABS: 20.0000 mg | ORAL_TABLET | Freq: Two times a day (BID) | ORAL | 0 refills | Status: DC

## 2019-12-15 IMAGING — CR DG FINGER RING 2+V*R*
1 series · 3 of 3 positions shown · non-contrast
Comparison: None.

CLINICAL DATA: Patient states he was playing basketball about 2
months ago when he hit a wall with his right hand causing his ring
finger to dislocate at PIP joint where patient states he popped it
back in place and taped it to another finger. Patient has swelling
and discomfort near PIP joint of right ring finger. Patients finger
taped into position for lateral view.

EXAM:
RIGHT RING FINGER 2+V

[Series 1: dg finger ring right · 0.14mm/px · 3 of 3 slices shown]
[im 1/3]
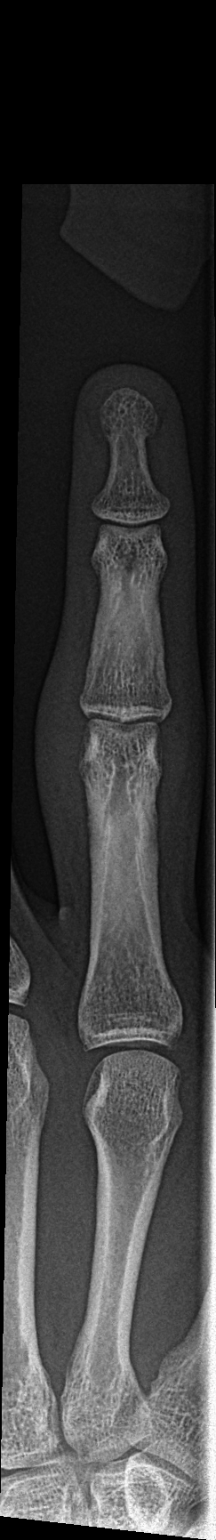
[im 2/3]
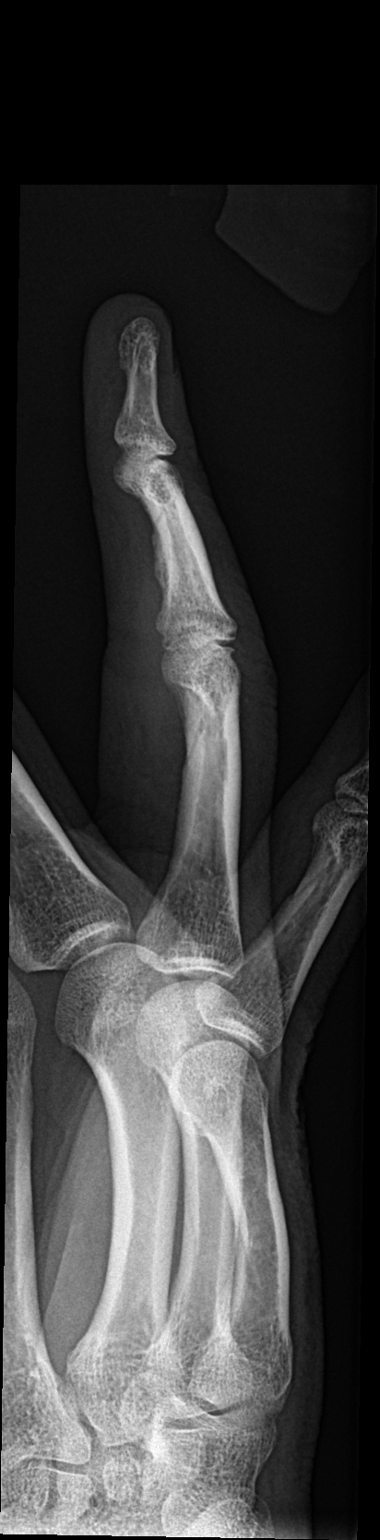
[im 3/3]
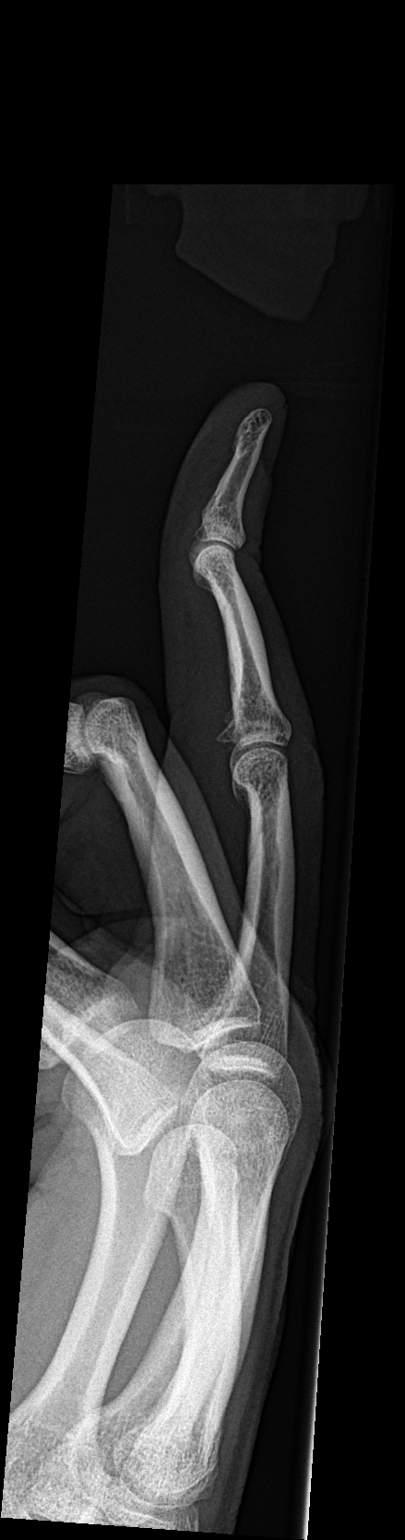

[3 of 3 positions shown; findings below may reference images not displayed]

FINDINGS: No acute fracture.

Best seen on the lateral view, there is a triangular shaped
projection of bone from the volar margin at the base of the middle
phalanx, with subtle irregularity noted along the volar margin of
the articular surface of the middle phalanx at the PIP joint. These
findings are consistent with an avulsion fracture that has healed
with residual deformity.

There is soft tissue swelling around the PIP joint, asymmetric,
predominantly over the radial aspect.
IMPRESSION: 1. Findings consistent with a 2-month-old volar plate avulsion
fracture at the base of the middle phalanx of the right ring finger,
now healed with residual deformity along the volar base of the
middle phalanx, with persistent surrounding soft tissue swelling.
There may be associated ligamentous injury.

## 2019-12-22 ENCOUNTER — Other Ambulatory Visit: Payer: Self-pay | Admitting: Family Medicine

## 2019-12-22 DIAGNOSIS — F900 Attention-deficit hyperactivity disorder, predominantly inattentive type: Secondary | ICD-10-CM

## 2019-12-22 MED ORDER — AMPHETAMINE-DEXTROAMPHETAMINE 20 MG PO TABS: 20.0000 mg | ORAL_TABLET | Freq: Two times a day (BID) | ORAL | 0 refills | Status: DC

## 2020-01-20 ENCOUNTER — Other Ambulatory Visit: Payer: Self-pay | Admitting: Physician Assistant

## 2020-01-20 DIAGNOSIS — F900 Attention-deficit hyperactivity disorder, predominantly inattentive type: Secondary | ICD-10-CM

## 2020-01-20 MED ORDER — AMPHETAMINE-DEXTROAMPHETAMINE 20 MG PO TABS: 20.0000 mg | ORAL_TABLET | Freq: Two times a day (BID) | ORAL | 0 refills | Status: DC

## 2020-02-23 ENCOUNTER — Other Ambulatory Visit: Payer: Self-pay | Admitting: Physician Assistant

## 2020-02-23 DIAGNOSIS — F900 Attention-deficit hyperactivity disorder, predominantly inattentive type: Secondary | ICD-10-CM

## 2020-02-23 MED ORDER — AMPHETAMINE-DEXTROAMPHETAMINE 20 MG PO TABS: 20.0000 mg | ORAL_TABLET | Freq: Two times a day (BID) | ORAL | 0 refills | Status: DC

## 2020-03-22 ENCOUNTER — Ambulatory Visit (INDEPENDENT_AMBULATORY_CARE_PROVIDER_SITE_OTHER): Payer: Commercial Managed Care - PPO | Admitting: Physician Assistant

## 2020-03-22 ENCOUNTER — Ambulatory Visit: Payer: Self-pay

## 2020-03-22 ENCOUNTER — Encounter: Payer: Self-pay | Admitting: Physician Assistant

## 2020-03-22 ENCOUNTER — Other Ambulatory Visit: Payer: Self-pay

## 2020-03-22 VITALS — BP 127/87 | HR 71 | Temp 97.7°F | Wt 252.0 lb

## 2020-03-22 DIAGNOSIS — W540XXA Bitten by dog, initial encounter: Secondary | ICD-10-CM | POA: Diagnosis not present

## 2020-03-22 DIAGNOSIS — L539 Erythematous condition, unspecified: Secondary | ICD-10-CM | POA: Diagnosis not present

## 2020-03-22 MED ORDER — AMOXICILLIN-POT CLAVULANATE 875-125 MG PO TABS: 1.0000 | ORAL_TABLET | Freq: Two times a day (BID) | ORAL | 0 refills | Status: DC

## 2020-03-22 NOTE — Progress Notes (Signed)
Established patient visit   Patient: Mario Armstrong   DOB: 06/23/1970   50 y.o. Male  MRN: [id number] Visit Date: 03/22/2020  Today's healthcare provider: Mar Daring, PA-C   Chief Complaint  Patient presents with  . Animal Bite   Subjective    Animal Bite  The incident occurred yesterday. The incident occurred at home. There is an injury to the left forearm. Pertinent negatives include no headaches and no light-headedness. His tetanus status is UTD.     Patient Active Problem List   Diagnosis Date Noted  . Attention deficit hyperactivity disorder (ADHD), predominantly inattentive type 03/19/2017  . BMI 26.0-26.9,adult 03/19/2017   Past Medical History:  Diagnosis Date  . ADD (attention deficit disorder)    Social History   Tobacco Use  . Smoking status: Former Smoker    Quit date: 01/30/2016    Years since quitting: 4.1  . Smokeless tobacco: Never Used  Substance Use Topics  . Alcohol use: Yes    Alcohol/week: 1.0 - 2.0 standard drink    Types: 1 - 2 Cans of beer per week  . Drug use: No   No Known Allergies   Medications: Outpatient Medications Prior to Visit  Medication Sig  . amphetamine-dextroamphetamine (ADDERALL) 20 MG tablet Take 1 tablet (20 mg total) by mouth 2 (two) times daily.  . NEOMYCIN-POLYMYXIN-HYDROCORTISONE (CORTISPORIN) 1 % SOLN OTIC solution Place 3 drops into the left ear 4 (four) times daily. X 5 - 7 days   No facility-administered medications prior to visit.    Review of Systems  Constitutional: Negative.   Skin: Positive for wound. Negative for color change, pallor and rash.  Neurological: Negative for dizziness, light-headedness and headaches.        Objective    There were no vitals taken for this visit.   Physical Exam Vitals reviewed.  Constitutional:      General: He is not in acute distress.    Appearance: Normal appearance. He is well-developed and well-nourished. He is not ill-appearing.  HENT:      Head: Normocephalic and atraumatic.  Eyes:     Extraocular Movements: EOM normal.     Conjunctiva/sclera: Conjunctivae normal.  Pulmonary:     Effort: Pulmonary effort is normal. No respiratory distress.  Musculoskeletal:     Cervical back: Normal range of motion and neck supple.  Skin:    Findings: Wound present.       Neurological:     Mental Status: He is alert.  Psychiatric:        Mood and Affect: Mood and affect and mood normal.        Behavior: Behavior normal.        Thought Content: Thought content normal.        Judgment: Judgment normal.      No results found for any visits on 03/22/20.  Assessment & Plan     1. Dog bite, initial encounter Tdap was updated in 2020. Advised to wash wound with soap and water. May apply ice over area for swelling. Keep dry dressing in place. Augmentin given as below to cover for possible infections. Call if worsening.  - amoxicillin-clavulanate (AUGMENTIN) 875-125 MG tablet; Take 1 tablet by mouth 2 (two) times daily.  Dispense: 20 tablet; Refill: 0  No follow-ups on file.      Reynolds Bowl, PA-C, have reviewed all documentation for this visit. The documentation on 03/22/20 for the exam, diagnosis, procedures, and orders are all  accurate and complete.   Rubye Beach  [facility] [phone] (phone) [phone] (fax)  [place]

## 2020-03-22 NOTE — Patient Instructions (Signed)
Animal Bite, Adult Animal bite wounds can be mild or serious. It is important to get medical treatment to prevent infection. Ask your doctor if you need treatment to prevent an infection that can spread from animals to humans (rabies). Follow these instructions at home: Wound care  Follow instructions from your doctor about how to take care of your wound. Make sure you: ? Wash your hands with soap and water before you change your bandage (dressing). If you cannot use soap and water, use hand sanitizer. ? Change your bandage as told by your doctor. ? Leave stitches (sutures), skin glue, or skin tape (adhesive) strips in place. They may need to stay in place for 2 weeks or longer. If tape strips get loose and curl up, you may trim the loose edges. Do not remove tape strips completely unless your doctor says it is okay.  Check your wound every day for signs of infection. Check for: ? More redness, swelling, or pain. ? More fluid or blood. ? Warmth. ? Pus or a bad smell.   Medicines  Take or apply over-the-counter and prescription medicines only as told by your doctor.  If you were prescribed an antibiotic, take or apply it as told by your doctor. Do not stop using the antibiotic even if your wound gets better. General instructions  Keep the injured area raised (elevated) above the level of your heart while you are sitting or lying down.  If directed, put ice on the injured area. ? Put ice in a plastic bag. ? Place a towel between your skin and the bag. ? Leave the ice on for 20 minutes, 2-3 times per day.  Keep all follow-up visits as told by your doctor. This is important.   Contact a doctor if:  You have more redness, swelling, or pain around your wound.  Your wound feels warm to the touch.  You have a fever or chills.  You have a general feeling of sickness (malaise).  You feel sick to your stomach (nauseous).  You throw up (vomit).  You have pain that does not get  better. Get help right away if:  You have a red streak going away from your wound.  You have any of these coming from your wound: ? Non-clear fluid. ? More blood. ? Pus or a bad smell.  You have trouble moving your injured area.  You lose feeling (have numbness) or feel tingling anywhere on your body. Summary  It is important to get the right medical treatment for animal bites. Treatment can help you to not get an infection. Ask your doctor if you need treatment to prevent an infection that can spread from animals to humans (rabies).  Check your wound every day for signs of infection, such as more redness or swelling instead of less.  If you have a red streak going away from your wound, get medical help right away. This information is not intended to replace advice given to you by your health care provider. Make sure you discuss any questions you have with your health care provider. Document Revised: 01/09/2017 Document Reviewed: 07/25/2016 Elsevier Patient Education  2021 [employer].

## 2020-03-22 NOTE — Telephone Encounter (Signed)
Pt. Reports his dog bit him last night on left forearm. Has 4 puncture wounds. Cleaned area with soap and water, applied antibiotic ointment. Appointment for today.  Reason for Disposition . Looks infected (red area, red streak, or pus)  Answer Assessment - Initial Assessment Questions 1. ANIMAL: "What type of animal caused the bite?" "Is the injury from a bite or a claw?" If the animal is a dog or a cat, ask: "Was it a pet or a stray?" "Was it acting ill or behaving strangely?"     Dog - pet 2. LOCATION: "Where is the bite located?"      Left arm forearm 3. SIZE: "How big is the bite?" "What does it look like?"      Punctures - 4 4. ONSET: "When did the bite happen?" (Minutes or hours ago)      Last night 5. CIRCUMSTANCES: "Tell me how this happened."      Swollen, red 6. TETANUS: "When was the last tetanus booster?"     Last year 7. PREGNANCY: "Is there any chance you are pregnant?" "When was your last menstrual period?"     n/a  Protocols used: ANIMAL BITE-A-AH

## 2020-03-25 ENCOUNTER — Other Ambulatory Visit: Payer: Self-pay | Admitting: Physician Assistant

## 2020-03-25 DIAGNOSIS — F900 Attention-deficit hyperactivity disorder, predominantly inattentive type: Secondary | ICD-10-CM

## 2020-03-27 MED ORDER — AMPHETAMINE-DEXTROAMPHETAMINE 20 MG PO TABS: 20.0000 mg | ORAL_TABLET | Freq: Two times a day (BID) | ORAL | 0 refills | Status: DC

## 2020-04-20 ENCOUNTER — Encounter: Payer: Self-pay | Admitting: Physician Assistant

## 2020-04-20 DIAGNOSIS — F900 Attention-deficit hyperactivity disorder, predominantly inattentive type: Secondary | ICD-10-CM

## 2020-04-20 DIAGNOSIS — W540XXA Bitten by dog, initial encounter: Secondary | ICD-10-CM

## 2020-04-27 MED ORDER — AMPHETAMINE-DEXTROAMPHETAMINE 20 MG PO TABS: 20.0000 mg | ORAL_TABLET | Freq: Two times a day (BID) | ORAL | 0 refills | Status: DC

## 2020-06-05 ENCOUNTER — Other Ambulatory Visit: Payer: Self-pay | Admitting: Physician Assistant

## 2020-06-05 DIAGNOSIS — F900 Attention-deficit hyperactivity disorder, predominantly inattentive type: Secondary | ICD-10-CM

## 2020-06-05 MED ORDER — AMPHETAMINE-DEXTROAMPHETAMINE 20 MG PO TABS: 20.0000 mg | ORAL_TABLET | Freq: Two times a day (BID) | ORAL | 0 refills | Status: DC

## 2020-07-07 ENCOUNTER — Other Ambulatory Visit: Payer: Self-pay | Admitting: Family Medicine

## 2020-07-07 DIAGNOSIS — F900 Attention-deficit hyperactivity disorder, predominantly inattentive type: Secondary | ICD-10-CM

## 2020-07-07 MED ORDER — AMPHETAMINE-DEXTROAMPHETAMINE 20 MG PO TABS: 20.0000 mg | ORAL_TABLET | Freq: Two times a day (BID) | ORAL | 0 refills | Status: DC

## 2020-08-12 ENCOUNTER — Other Ambulatory Visit: Payer: Self-pay | Admitting: Family Medicine

## 2020-08-12 DIAGNOSIS — F900 Attention-deficit hyperactivity disorder, predominantly inattentive type: Secondary | ICD-10-CM

## 2020-08-12 MED ORDER — AMPHETAMINE-DEXTROAMPHETAMINE 20 MG PO TABS: 20.0000 mg | ORAL_TABLET | Freq: Two times a day (BID) | ORAL | 0 refills | Status: DC

## 2020-08-21 ENCOUNTER — Other Ambulatory Visit: Payer: Self-pay | Admitting: Family Medicine

## 2020-08-21 DIAGNOSIS — F900 Attention-deficit hyperactivity disorder, predominantly inattentive type: Secondary | ICD-10-CM

## 2020-08-23 ENCOUNTER — Telehealth: Payer: Self-pay | Admitting: Family Medicine

## 2020-08-23 DIAGNOSIS — F900 Attention-deficit hyperactivity disorder, predominantly inattentive type: Secondary | ICD-10-CM

## 2020-08-23 MED ORDER — AMPHETAMINE-DEXTROAMPHETAMINE 20 MG PO TABS: 20.0000 mg | ORAL_TABLET | Freq: Two times a day (BID) | ORAL | 0 refills | Status: DC

## 2020-08-23 NOTE — Telephone Encounter (Signed)
Pt is calling checking on status of generic adderall 20 mg. Transmission to pharmacy failed [facility] [address] [facility] [place] phone [phone]

## 2020-09-24 ENCOUNTER — Other Ambulatory Visit: Payer: Self-pay | Admitting: Family Medicine

## 2020-09-24 DIAGNOSIS — F900 Attention-deficit hyperactivity disorder, predominantly inattentive type: Secondary | ICD-10-CM

## 2020-09-26 MED ORDER — AMPHETAMINE-DEXTROAMPHETAMINE 20 MG PO TABS: 20.0000 mg | ORAL_TABLET | Freq: Two times a day (BID) | ORAL | 0 refills | Status: DC

## 2020-10-31 ENCOUNTER — Other Ambulatory Visit: Payer: Self-pay | Admitting: Family Medicine

## 2020-10-31 DIAGNOSIS — F900 Attention-deficit hyperactivity disorder, predominantly inattentive type: Secondary | ICD-10-CM

## 2020-11-07 ENCOUNTER — Ambulatory Visit (INDEPENDENT_AMBULATORY_CARE_PROVIDER_SITE_OTHER): Payer: Commercial Managed Care - PPO | Admitting: Family Medicine

## 2020-11-07 ENCOUNTER — Encounter: Payer: Self-pay | Admitting: Family Medicine

## 2020-11-07 ENCOUNTER — Other Ambulatory Visit: Payer: Self-pay

## 2020-11-07 VITALS — BP 126/84 | HR 83 | Temp 98.3°F | Resp 16 | Ht 77.0 in | Wt 252.2 lb

## 2020-11-07 DIAGNOSIS — F900 Attention-deficit hyperactivity disorder, predominantly inattentive type: Secondary | ICD-10-CM | POA: Diagnosis not present

## 2020-11-07 DIAGNOSIS — Z6829 Body mass index (BMI) 29.0-29.9, adult: Secondary | ICD-10-CM | POA: Insufficient documentation

## 2020-11-07 MED ORDER — AMPHETAMINE-DEXTROAMPHETAMINE 20 MG PO TABS: 20.0000 mg | ORAL_TABLET | Freq: Two times a day (BID) | ORAL | 0 refills | Status: DC

## 2020-11-07 NOTE — Assessment & Plan Note (Signed)
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

## 2020-11-07 NOTE — Progress Notes (Signed)
Established patient visit   Patient: Mario Armstrong   DOB: 18-Mar-1970   50 y.o. Male  MRN: [id number] Visit Date: 11/07/2020  Today's healthcare provider: Gwyneth Sprout, FNP   Chief Complaint  Patient presents with   ADHD   Subjective    HPI  Follow up for ADHD  The patient was last seen for this 2 years ago. Changes made at last visit include nonw.  He reports excellent compliance with treatment. He feels that condition is Unchanged. He is not having side effects.   -----------------------------------------------------------------------------------------     Medications: Outpatient Medications Prior to Visit  Medication Sig   NEOMYCIN-POLYMYXIN-HYDROCORTISONE (CORTISPORIN) 1 % SOLN OTIC solution Place 3 drops into the left ear 4 (four) times daily. X 5 - 7 days   amphetamine-dextroamphetamine (ADDERALL) 20 MG tablet Take 1 tablet (20 mg total) by mouth 2 (two) times daily.   No facility-administered medications prior to visit.    Review of Systems     Objective    BP 126/84   Pulse 83   Temp 98.3 F (36.8 C) (Oral)   Resp 16   Ht 6' 5"  (1.956 m)   Wt 252 lb 3.2 oz (114.4 kg)   BMI 29.91 kg/m    Physical Exam Vitals and nursing note reviewed.  Constitutional:      Appearance: Normal appearance. He is obese.  HENT:     Head: Normocephalic and atraumatic.  Eyes:     Pupils: Pupils are equal, round, and reactive to light.  Cardiovascular:     Rate and Rhythm: Normal rate and regular rhythm.     Pulses: Normal pulses.     Heart sounds: Normal heart sounds.  Pulmonary:     Effort: Pulmonary effort is normal.     Breath sounds: Normal breath sounds.  Musculoskeletal:        General: Normal range of motion.     Cervical back: Normal range of motion.  Skin:    General: Skin is warm and dry.     Capillary Refill: Capillary refill takes less than 2 seconds.  Neurological:     General: No focal deficit present.     Mental Status: He is alert  and oriented to person, place, and time. Mental status is at baseline.  Psychiatric:        Mood and Affect: Mood normal.        Behavior: Behavior normal.        Thought Content: Thought content normal.        Judgment: Judgment normal.     No results found for any visits on 11/07/20.  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      Vonna Kotyk, FNP, have reviewed all  documentation for this visit. The documentation on 11/07/20 for the exam, diagnosis, procedures, and orders are all accurate and complete.    Gwyneth Sprout, [place] [phone] (phone) [phone] (fax)  [place]

## 2020-11-07 NOTE — Assessment & Plan Note (Signed)
bmi 29.91 Discussed importance of healthy weight management Discussed diet and exercise

## 2020-12-15 ENCOUNTER — Other Ambulatory Visit: Payer: Self-pay | Admitting: Family Medicine

## 2020-12-15 DIAGNOSIS — F900 Attention-deficit hyperactivity disorder, predominantly inattentive type: Secondary | ICD-10-CM

## 2020-12-18 ENCOUNTER — Encounter: Payer: Self-pay | Admitting: Family Medicine

## 2021-01-31 ENCOUNTER — Other Ambulatory Visit: Payer: Self-pay | Admitting: Family Medicine

## 2021-01-31 DIAGNOSIS — F900 Attention-deficit hyperactivity disorder, predominantly inattentive type: Secondary | ICD-10-CM

## 2021-01-31 NOTE — Telephone Encounter (Signed)
Patietn is completely out please send short supply /Medication Refill - Medication:amphetamine-dextroamphetamine (ADDERALL) 20 MG tablet   Has the patient contacted their pharmacy? yes (Agent: If no, request that the patient contact the pharmacy for the refill. If patient does not wish to contact the pharmacy document the reason why and proceed with request.) (Agent: If yes, when and what did the pharmacy advise?)contact pcp, his pharmacyl ocation is out of the medication  Preferred Pharmacy (with phone number or street [address] [place], [place] [phone], [place], [place] [address] Has the patient been seen for an appointment in the last year OR does the patient have an upcoming appointment? yes  Agent: Please be advised that RX refills may take up to 3 business days. We ask that you follow-up with your pharmacy.

## 2021-02-01 MED ORDER — AMPHETAMINE-DEXTROAMPHETAMINE 20 MG PO TABS: 20.0000 mg | ORAL_TABLET | Freq: Two times a day (BID) | ORAL | 0 refills | Status: DC

## 2021-02-01 NOTE — Telephone Encounter (Signed)
Requested medication (s) are due for refill today: yes  Requested medication (s) are on the active medication list: yes   Last refill: 11/07/20 #60  0  refills  Future visit scheduled No  Notes to clinic: Please see note   Patient called in states pharmacy he requested yesterday is out of Adderall 43m  so he is requesting if he can get the 10 mg with 120 day supply in place of this and [id number] [place] [address] Phone:  [phone]:  [phone]    Requested Prescriptions  Pending Prescriptions Disp Refills   amphetamine-dextroamphetamine (ADDERALL) 20 MG tablet 60 tablet 0    Sig: Take 1 tablet (20 mg total) by mouth 2 (two) times daily. Do not fill <30 days from previous fill.     Not Delegated - Psychiatry:  Stimulants/ADHD Failed - 02/01/2021  4:51 PM      Failed - This refill cannot be delegated      Failed - Urine Drug Screen completed in last 360 days      Passed - Valid encounter within last 3 months    Recent Outpatient Visits           2 months ago Attention deficit hyperactivity disorder (ADHD), predominantly inattentive type   [facility]   10 months ago Dog bite, initial encounter   [facility] 1 year ago Non-recurrent acute suppurative otitis media of left ear without spontaneous rupture of tympanic membrane   [facility] 2 years ago Annual physical exam   [facility] [place] 3 years [facility] Attention deficit hyperactivity disorder (ADHD), predominantly inattentive type   [facility]

## 2021-02-01 NOTE — Telephone Encounter (Signed)
LOV: 11/07/2020  Thanks,   -Mickel Baas

## 2021-02-01 NOTE — Telephone Encounter (Signed)
Patient called in states pharmacy he requested yesterday is out of Adderall 78m  so he is requesting if he can get the 10 mg with 120 day supply in place of this and short [facility]  [id number] [place] [address] Phone:  [phone]:  [phone]

## 2021-02-01 NOTE — Telephone Encounter (Signed)
Requested medications are due for refill today.  yes  Requested medications are on the active medications list.  yes  Last refill. 11/07/2020  Future visit scheduled.   no  Notes to clinic.  Medication not delegated. There are actually 4 different  Rx's for Adderall on the med list.    Requested Prescriptions  Pending Prescriptions Disp Refills   amphetamine-dextroamphetamine (ADDERALL) 20 MG tablet 60 tablet 0    Sig: Take 1 tablet (20 mg total) by mouth 2 (two) times daily. Do not fill <30 days from previous fill.     Not Delegated - Psychiatry:  Stimulants/ADHD Failed - 01/31/2021  7:07 PM      Failed - This refill cannot be delegated      Failed - Urine Drug Screen completed in last 360 days      Passed - Valid encounter within last 3 months    Recent Outpatient Visits           2 months ago Attention deficit hyperactivity disorder (ADHD), predominantly inattentive type   [facility] Gwyneth Sprout, FNP   10 months ago Dog bite, initial encounter   [place], [place], [place]   1 year ago Non-recurrent acute suppurative otitis media of left ear without spontaneous rupture of tympanic membrane   [facility] [place], Clearnce Sorrel, [place]   2 years ago Annual physical exam   [facility] Fenton Malling M, [place]   3 years ago Attention deficit hyperactivity disorder (ADHD), predominantly inattentive type   [facility], Clearn[place] Sorrel, [place]

## 2021-02-02 ENCOUNTER — Other Ambulatory Visit: Payer: Self-pay | Admitting: Family Medicine

## 2021-02-02 ENCOUNTER — Ambulatory Visit: Payer: Self-pay | Admitting: *Deleted

## 2021-02-02 DIAGNOSIS — F900 Attention-deficit hyperactivity disorder, predominantly inattentive type: Secondary | ICD-10-CM

## 2021-02-02 MED ORDER — AMPHETAMINE-DEXTROAMPHETAMINE 10 MG PO TABS: 20.0000 mg | ORAL_TABLET | Freq: Two times a day (BID) | ORAL | 0 refills | Status: DC

## 2021-02-02 NOTE — Telephone Encounter (Signed)
Requesting medication adderall to be ordered as 10 mg tablets to take 2 tablets 2 times daily  instead of 20 mg 2 times daily due to [employer] and unavailable at multiple pharmacies. Patient reports his work is suffering and his request has not been met. Please advise .

## 2021-02-02 NOTE — Telephone Encounter (Signed)
Patient called in to inform Ms Mario Armstrong that he need the Rx for amphetamine-dextroamphetamine (ADDERALL) 20 MG tablet to be sent in for the 10 MG tabs since the 20 MG tabs are not available. Please call patient when done today  Ph# [phone] [facility]   [id number] [place]  [phone] Fax:  [phone]

## 2021-02-02 NOTE — Telephone Encounter (Signed)
Prescription sent

## 2021-03-19 ENCOUNTER — Encounter: Payer: Self-pay | Admitting: Family Medicine

## 2021-03-19 ENCOUNTER — Other Ambulatory Visit: Payer: Self-pay

## 2021-03-19 ENCOUNTER — Ambulatory Visit (INDEPENDENT_AMBULATORY_CARE_PROVIDER_SITE_OTHER): Payer: Commercial Managed Care - PPO | Admitting: Family Medicine

## 2021-03-19 VITALS — BP 127/87 | HR 70 | Wt 253.0 lb

## 2021-03-19 DIAGNOSIS — F900 Attention-deficit hyperactivity disorder, predominantly inattentive type: Secondary | ICD-10-CM

## 2021-03-19 DIAGNOSIS — Z1211 Encounter for screening for malignant neoplasm of colon: Secondary | ICD-10-CM | POA: Diagnosis not present

## 2021-03-19 DIAGNOSIS — E661 Drug-induced obesity: Secondary | ICD-10-CM

## 2021-03-19 DIAGNOSIS — Z6830 Body mass index (BMI) 30.0-30.9, adult: Secondary | ICD-10-CM

## 2021-03-19 MED ORDER — AMPHETAMINE-DEXTROAMPHETAMINE 10 MG PO TABS: 20.0000 mg | ORAL_TABLET | Freq: Two times a day (BID) | ORAL | 0 refills | Status: DC

## 2021-03-19 NOTE — Assessment & Plan Note (Signed)
Will be due for colo guard in 05/2021 Repeat test ordered Previous negative test- no complaints

## 2021-03-19 NOTE — Assessment & Plan Note (Signed)
Chronic, stable Denies insomnia, irritability, weight loss

## 2021-03-19 NOTE — Assessment & Plan Note (Signed)
BMI 30 Recent weight gain; reports less activity in the winter golfs in the spring/summer

## 2021-03-19 NOTE — Progress Notes (Signed)
[place] P[place],acting as a scribe for Gwyneth Sprout, FNP.,have documented all relevant documentation on the behalf of Gwyneth Sprout, FNP,as directed by  Gwyneth Sprout, FNP while in the presence of Gwyneth Sprout, FNP.     Established patient visit   Patient: Mario Armstrong   DOB: [birth year]   51 y.o. Male  MRN: [id number] Visit Date: 03/19/2021  Today's healthcare provider: Gwyneth Sprout, FNP   No chief complaint on file.  Subjective    HPI  Patient is a 51 year old male who presents today for follow up on his ADD.  He is currently on Adderall 10 mg 2 pills twice daily.  Patient feels this is a good dose for him.  He likes having the 10 mg pills as he feels some days he does not need the total of 40 mg.  Medications: Outpatient Medications Prior to Visit  Medication Sig   NEOMYCIN-POLYMYXIN-HYDROCORTISONE (CORTISPORIN) 1 % SOLN OTIC solution Place 3 drops into the left ear 4 (four) times daily. X 5 - 7 days   [DISCONTINUED] amphetamine-dextroamphetamine (ADDERALL) 10 MG tablet Take 2 tablets (20 mg total) by mouth 2 (two) times daily.   No facility-administered medications prior to visit.    Review of Systems  Psychiatric/Behavioral:  Negative for agitation, confusion, decreased concentration, dysphoric mood, hallucinations, self-injury and sleep disturbance. The patient is not nervous/anxious and is not hyperactive.       Objective    BP 127/87 (BP Location: Right Arm, Patient Position: Sitting, Cuff Size: Large)    Pulse 70    Wt 253 lb (114.8 kg)    SpO2 100%    BMI 30.00 kg/m    Physical Exam Vitals and nursing note reviewed.  Constitutional:      Appearance: Normal appearance. He is obese.  HENT:     Head: Normocephalic and atraumatic.  Eyes:     Pupils: Pupils are equal, round, and reactive to light.  Cardiovascular:     Rate and Rhythm: Normal rate and regular rhythm.     Pulses: Normal pulses.     Heart sounds: Normal heart sounds.  Pulmonary:      Effort: Pulmonary effort is normal.     Breath sounds: Normal breath sounds.  Musculoskeletal:        General: Normal range of motion.     Cervical back: Normal range of motion.  Skin:    General: Skin is warm and dry.     Capillary Refill: Capillary refill takes less than 2 seconds.  Neurological:     General: No focal deficit present.     Mental Status: He is alert and oriented to person, place, and time. Mental status is at baseline.  Psychiatric:        Mood and Affect: Mood normal.        Behavior: Behavior normal.        Thought Content: Thought content normal.        Judgment: Judgment normal.     No results found for any visits on 03/19/21.  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      Vonna Kotyk, FNP, have reviewed all documentation for this visit. The documentation on 03/19/21 for the exam, diagnosis, procedures, and orders are all accurate and complete.    Gwyneth Sprout, [place] [phone] (phone) [phone] (fax)  [place]

## 2021-03-27 ENCOUNTER — Other Ambulatory Visit: Payer: Self-pay | Admitting: Family Medicine

## 2021-03-27 ENCOUNTER — Encounter: Payer: Self-pay | Admitting: Family Medicine

## 2021-04-03 ENCOUNTER — Other Ambulatory Visit: Payer: Self-pay | Admitting: Family Medicine

## 2021-04-04 ENCOUNTER — Other Ambulatory Visit: Payer: Self-pay | Admitting: Family Medicine

## 2021-04-04 DIAGNOSIS — F900 Attention-deficit hyperactivity disorder, predominantly inattentive type: Secondary | ICD-10-CM

## 2021-04-04 MED ORDER — LISDEXAMFETAMINE DIMESYLATE 30 MG PO CAPS: 30.0000 mg | ORAL_CAPSULE | Freq: Every day | ORAL | 0 refills | Status: DC

## 2021-04-06 ENCOUNTER — Other Ambulatory Visit: Payer: Self-pay | Admitting: Family Medicine

## 2021-04-06 ENCOUNTER — Encounter: Payer: Self-pay | Admitting: Family Medicine

## 2021-04-06 DIAGNOSIS — F900 Attention-deficit hyperactivity disorder, predominantly inattentive type: Secondary | ICD-10-CM

## 2021-04-06 MED ORDER — LISDEXAMFETAMINE DIMESYLATE 30 MG PO CAPS: 30.0000 mg | ORAL_CAPSULE | Freq: Every day | ORAL | 0 refills | Status: DC

## 2021-05-10 ENCOUNTER — Encounter: Payer: Self-pay | Admitting: Family Medicine

## 2021-05-10 DIAGNOSIS — F900 Attention-deficit hyperactivity disorder, predominantly inattentive type: Secondary | ICD-10-CM

## 2021-05-11 ENCOUNTER — Telehealth: Payer: Self-pay | Admitting: Family Medicine

## 2021-05-11 NOTE — Telephone Encounter (Signed)
Advised patient that pre-authorization was received on 05/09/21 and sent to plan on 05/10/21, response times can take 48-72 hours, patient states that when he needs refill he always runs into issue with needing a pre-authorization and being without medication because pre-authorization is pending or in progress. Patient states that he would like to discuss increasing dosage with PCP. KW ?

## 2021-05-11 NOTE — Telephone Encounter (Signed)
Pt called reporting that the pharmacy has sent a request for pre-approval to the office on Monday. The patient says he is out of his medication and needs a refill today. He is very upset and wants to speak to leadership, he says that he has dealt with this the last three times he has needed refills and this is affecting his job. Please advise, pt called during lunch.  ? ?lisdexamfetamine (VYVANSE) 30 MG capsule  ? ?Best contact: [phone] ? ?[id number] [place] [facility] [address]  [place] [place] ?Phone: [phone]: [phone] ? ?He says this is malpractice ?

## 2021-06-11 MED ORDER — LISDEXAMFETAMINE DIMESYLATE 30 MG PO CAPS: 30.0000 mg | ORAL_CAPSULE | Freq: Every day | ORAL | 0 refills | Status: DC

## 2021-06-13 ENCOUNTER — Other Ambulatory Visit: Payer: Self-pay | Admitting: Family Medicine

## 2021-06-13 ENCOUNTER — Telehealth: Payer: Self-pay | Admitting: Family Medicine

## 2021-06-13 MED ORDER — AMPHETAMINE-DEXTROAMPHETAMINE 20 MG PO TABS: 20.0000 mg | ORAL_TABLET | Freq: Two times a day (BID) | ORAL | 0 refills | Status: DC

## 2021-06-13 NOTE — Telephone Encounter (Signed)
Patient called and advised Adderall 20 mg is not on his current medication list. He says it should be because he is taking that because the Vyvanse did not work. He says the only reason he tried it was because of the shortage of Adderall. He says he wants the Rx for Vyvanse cancelled [facility] [place] [facility] and [facility] in to [facility] in [place] for the Adderall 20 mg BID. Advised I will send this to [facility] for approval. ?

## 2021-06-13 NOTE — Telephone Encounter (Signed)
Medication Refill - Medication: adderall 20 mg ? ?Has the patient contacted their pharmacy? y ?(Agent: If no, request that the patient contact the pharmacy for the refill. If patient does not wish to contact the pharmacy document the reason why and proceed with request.) ?(Agent: If yes, when and what did the pharmacy advise?)contact pcp, he says wrong medication([facility] [place] [facility]) ? ?Preferred Pharmacy (with phone number or street name): ?[id number] [place]  [phone] ?Fax:  [phone] ?  ? ?Has the patient been seen for an appointment in the last year OR does the patient have an upcoming appointment? yes ? ?Agent: Please be advised that RX refills may take up to 3 business days. We ask that you follow-up with your pharmacy.  ?

## 2021-07-16 ENCOUNTER — Other Ambulatory Visit: Payer: Self-pay | Admitting: Family Medicine

## 2021-07-17 MED ORDER — AMPHETAMINE-DEXTROAMPHETAMINE 20 MG PO TABS: 20.0000 mg | ORAL_TABLET | Freq: Two times a day (BID) | ORAL | 0 refills | Status: DC

## 2021-07-20 LAB — COLOGUARD: COLOGUARD: NEGATIVE

## 2021-08-20 ENCOUNTER — Other Ambulatory Visit: Payer: Self-pay | Admitting: Family Medicine

## 2021-08-21 MED ORDER — AMPHETAMINE-DEXTROAMPHETAMINE 20 MG PO TABS: 20.0000 mg | ORAL_TABLET | Freq: Two times a day (BID) | ORAL | 0 refills | Status: DC

## 2021-09-25 ENCOUNTER — Other Ambulatory Visit: Payer: Self-pay | Admitting: Family Medicine

## 2021-09-26 MED ORDER — AMPHETAMINE-DEXTROAMPHETAMINE 20 MG PO TABS: 20.0000 mg | ORAL_TABLET | Freq: Two times a day (BID) | ORAL | 0 refills | Status: DC

## 2021-10-02 NOTE — Progress Notes (Unsigned)
Vivien Rota De[place]anto,acting as a scribe for Jacky Kindle, FNP.,have documented all relevant documentation on the behalf of Jacky Kindle, FNP,as directed by  Jacky Kindle, FNP while in the presence of Jacky Kindle, FNP.   Established patient visit   Patient: Mario Armstrong   DOB: Sep 04, 1970   51 y.o. Male  MRN: [id number] Visit Date: 10/03/2021  Today's healthcare provider: Jacky Kindle, FNP  Re Introduced to nurse practitioner role and practice setting.  All questions answered.  Discussed provider/patient relationship and expectations.  Subjective    HPI  Follow up for ADHD  The patient was last seen for this 6 months ago. Changes made at last visit include continue adderall 10-20 mg, twice a day.  He reports good compliance with treatment. He feels that condition is Improved. He is not having side effects.   -----------------------------------------------------------------------------------------   Medications: Outpatient Medications Prior to Visit  Medication Sig   amphetamine-dextroamphetamine (ADDERALL) 20 MG tablet Take 1 tablet (20 mg total) by mouth 2 (two) times daily.   [DISCONTINUED] amphetamine-dextroamphetamine (ADDERALL) 20 MG tablet Take 1 tablet (20 mg total) by mouth 2 (two) times daily.   [DISCONTINUED] amphetamine-dextroamphetamine (ADDERALL) 20 MG tablet Take 1 tablet (20 mg total) by mouth 2 (two) times daily.   No facility-administered medications prior to visit.    Review of Systems  Psychiatric/Behavioral:  Negative for agitation, behavioral problems, confusion, decreased concentration, dysphoric mood, hallucinations, self-injury, sleep disturbance and suicidal ideas. The patient is not nervous/anxious and is not hyperactive.      Objective    BP 113/78 (BP Location: Right Arm, Patient Position: Sitting, Cuff Size: Normal)   Pulse 69   Temp (!) 97.4 F (36.3 C) (Oral)   Wt 237 lb (107.5 kg)   SpO2 96%   BMI 28.10 kg/m   Physical  Exam Vitals and nursing note reviewed.  Constitutional:      Appearance: Normal appearance. He is overweight.  HENT:     Head: Normocephalic and atraumatic.  Eyes:     Pupils: Pupils are equal, round, and reactive to light.  Cardiovascular:     Rate and Rhythm: Normal rate and regular rhythm.     Pulses: Normal pulses.     Heart sounds: Normal heart sounds.  Pulmonary:     Effort: Pulmonary effort is normal.     Breath sounds: Normal breath sounds.  Musculoskeletal:        General: Normal range of motion.     Cervical back: Normal range of motion.  Skin:    General: Skin is warm and dry.     Capillary Refill: Capillary refill takes less than 2 seconds.  Neurological:     General: No focal deficit present.     Mental Status: He is alert and oriented to person, place, and time. Mental status is at baseline.  Psychiatric:        Mood and Affect: Mood normal.        Behavior: Behavior normal.        Thought Content: Thought content normal.        Judgment: Judgment normal.     No results found for any visits on 10/03/21.  Assessment & Plan     Problem List Items Addressed This Visit       Other   Attention deficit hyperactivity disorder (ADHD), predominantly inattentive type - Primary    Chronic, stable Previously switched to 10 mg Adderall given shortage; happy to be back on  home dose of 20 mg twice daily. Reports purposeful weight loss, chronic sleep issues related to late caffeine/excess caffeine intake. Denies irritability or side effects.  May take drug holidays on the weekend. However, has been doing a lot of home projects on his home on 20 acres in [place]; may plan to sell and move to a Condo with his g/f       Return in about 6 months (around 04/03/2022) for chonic disease management.     Leilani Merl, FNP, have reviewed all documentation for this visit. The documentation on 10/03/21 for the exam, diagnosis, procedures, and orders are all accurate and  complete.  Jacky Kindle, FNP  [facility] [phone] (phone) [phone] (fax)  [facility]

## 2021-10-03 ENCOUNTER — Ambulatory Visit (INDEPENDENT_AMBULATORY_CARE_PROVIDER_SITE_OTHER): Payer: Commercial Managed Care - PPO | Admitting: Family Medicine

## 2021-10-03 ENCOUNTER — Encounter: Payer: Self-pay | Admitting: Family Medicine

## 2021-10-03 VITALS — BP 113/78 | HR 69 | Temp 97.4°F | Wt 237.0 lb

## 2021-10-03 DIAGNOSIS — F900 Attention-deficit hyperactivity disorder, predominantly inattentive type: Secondary | ICD-10-CM

## 2021-10-03 NOTE — Assessment & Plan Note (Signed)
Chronic, stable Previously switched to 10 mg Adderall given shortage; happy to be back on home dose of 20 mg twice daily. Reports purposeful weight loss, chronic sleep issues related to late caffeine/excess caffeine intake. Denies irritability or side effects.  May take drug holidays on the weekend. However, has been doing a lot of home projects on his home on [address] acres in [place]; may plan to sell and move to a Condo with his g/f

## 2021-10-31 ENCOUNTER — Other Ambulatory Visit: Payer: Self-pay | Admitting: Family Medicine

## 2021-10-31 MED ORDER — AMPHETAMINE-DEXTROAMPHETAMINE 20 MG PO TABS: 20.0000 mg | ORAL_TABLET | Freq: Two times a day (BID) | ORAL | 0 refills | Status: DC

## 2021-12-03 ENCOUNTER — Other Ambulatory Visit: Payer: Self-pay | Admitting: Family Medicine

## 2021-12-05 MED ORDER — AMPHETAMINE-DEXTROAMPHETAMINE 20 MG PO TABS: 20.0000 mg | ORAL_TABLET | Freq: Two times a day (BID) | ORAL | 0 refills | Status: DC

## 2022-01-11 ENCOUNTER — Other Ambulatory Visit: Payer: Self-pay | Admitting: Family Medicine

## 2022-01-15 ENCOUNTER — Encounter: Payer: Self-pay | Admitting: Family Medicine

## 2022-01-15 NOTE — Telephone Encounter (Signed)
Pt called to check status of refill request for ADDERALL/ please advise

## 2022-01-16 MED ORDER — AMPHETAMINE-DEXTROAMPHETAMINE 20 MG PO TABS: 20.0000 mg | ORAL_TABLET | Freq: Two times a day (BID) | ORAL | 0 refills | Status: DC

## 2022-02-14 ENCOUNTER — Other Ambulatory Visit: Payer: Self-pay | Admitting: Family Medicine

## 2022-02-15 MED ORDER — AMPHETAMINE-DEXTROAMPHETAMINE 20 MG PO TABS: 20.0000 mg | ORAL_TABLET | Freq: Two times a day (BID) | ORAL | 0 refills | Status: DC

## 2022-02-15 NOTE — Telephone Encounter (Signed)
Please review. Last office visit 10/03/2021.  KP

## 2022-03-20 ENCOUNTER — Other Ambulatory Visit: Payer: Self-pay | Admitting: Family Medicine

## 2022-03-21 MED ORDER — AMPHETAMINE-DEXTROAMPHETAMINE 20 MG PO TABS: 20.0000 mg | ORAL_TABLET | Freq: Two times a day (BID) | ORAL | 0 refills | Status: DC

## 2022-04-18 ENCOUNTER — Other Ambulatory Visit: Payer: Self-pay | Admitting: Family Medicine

## 2022-04-22 MED ORDER — AMPHETAMINE-DEXTROAMPHETAMINE 20 MG PO TABS: 20.0000 mg | ORAL_TABLET | Freq: Two times a day (BID) | ORAL | 0 refills | Status: DC

## 2022-05-21 ENCOUNTER — Other Ambulatory Visit: Payer: Self-pay | Admitting: Family Medicine

## 2022-05-22 MED ORDER — AMPHETAMINE-DEXTROAMPHETAMINE 20 MG PO TABS: 20.0000 mg | ORAL_TABLET | Freq: Two times a day (BID) | ORAL | 0 refills | Status: DC

## 2022-06-20 ENCOUNTER — Other Ambulatory Visit: Payer: Self-pay | Admitting: Family Medicine

## 2022-06-20 NOTE — Telephone Encounter (Signed)
LVMTCB. Patient needs an office visit for further refills. CRM created. Ok for [facility] to advise

## 2022-06-27 ENCOUNTER — Ambulatory Visit (INDEPENDENT_AMBULATORY_CARE_PROVIDER_SITE_OTHER): Payer: Commercial Managed Care - PPO | Admitting: Family Medicine

## 2022-06-27 ENCOUNTER — Encounter: Payer: Self-pay | Admitting: Family Medicine

## 2022-06-27 VITALS — BP 115/75 | Ht 77.0 in | Wt 228.0 lb

## 2022-06-27 DIAGNOSIS — F900 Attention-deficit hyperactivity disorder, predominantly inattentive type: Secondary | ICD-10-CM

## 2022-06-27 DIAGNOSIS — Z808 Family history of malignant neoplasm of other organs or systems: Secondary | ICD-10-CM | POA: Diagnosis not present

## 2022-06-27 MED ORDER — AMPHETAMINE-DEXTROAMPHETAMINE 20 MG PO TABS: 20.0000 mg | ORAL_TABLET | Freq: Two times a day (BID) | ORAL | 0 refills | Status: DC

## 2022-06-27 NOTE — Patient Instructions (Signed)
The CDC recommends two doses of Shingrix (the new shingles vaccine) separated by 2 to 6 months for adults age 50 years and older. I recommend checking with your insurance plan regarding coverage for this vaccine.    

## 2022-06-27 NOTE — Assessment & Plan Note (Signed)
Chronic, stable PDMP reviewed Continues to use 1-2 tabs/day Well controlled BP, weight, sleep all stable No complaints Continue to recommend 6 month return to clinic appts

## 2022-06-27 NOTE — Assessment & Plan Note (Signed)
No current skin concerns; request for referral to derm. +family hx Plays golf and spends time outside on his property

## 2022-06-27 NOTE — Progress Notes (Signed)
Established patient visit   Patient: Mario Armstrong   DOB: January 24, 1971   51 y.o. Male  MRN: [id number] Visit Date: 06/27/2022  Today's healthcare provider: Jacky Kindle, FNP  Re Introduced to nurse practitioner role and practice setting.  All questions answered.  Discussed provider/patient relationship and expectations.  Chief Complaint  Patient presents with   Medication Refill    Rx Adderall Pt stated--sometimes taking 2 tab daily instead of 1 tab.   Subjective    Medication Refill   HPI     Medication Refill    Additional comments: Rx Adderall Pt stated--sometimes taking 2 tab daily instead of 1 tab.      Last edited by Shelly Bombard, CMA on 06/27/2022  8:44 AM.      Medications: Outpatient Medications Prior to Visit  Medication Sig   amphetamine-dextroamphetamine (ADDERALL) 20 MG tablet Take 1 tablet (20 mg total) by mouth 2 (two) times daily. Please make an office visit for further refills.   No facility-administered medications prior to visit.    Review of Systems    Objective    BP 115/75 (BP Location: Right Arm, Patient Position: Sitting, Cuff Size: Normal)   Ht 6\' 5"  (1.956 m)   Wt 228 lb (103.4 kg)   SpO2 98%   BMI 27.04 kg/m   BP Readings from Last 3 Encounters:  06/27/22 115/75  10/03/21 113/78  03/19/21 127/87   Wt Readings from Last 3 Encounters:  06/27/22 228 lb (103.4 kg)  10/03/21 237 lb (107.5 kg)  03/19/21 253 lb (114.8 kg)   Physical Exam Vitals and nursing note reviewed.  Constitutional:      Appearance: Normal appearance. He is overweight.  HENT:     Head: Normocephalic and atraumatic.  Cardiovascular:     Rate and Rhythm: Normal rate and regular rhythm.     Pulses: Normal pulses.     Heart sounds: Normal heart sounds.  Pulmonary:     Effort: Pulmonary effort is normal.     Breath sounds: Normal breath sounds.  Musculoskeletal:        General: Normal range of motion.     Cervical back: Normal range of motion.   Skin:    General: Skin is warm and dry.     Capillary Refill: Capillary refill takes less than 2 seconds.  Neurological:     General: No focal deficit present.     Mental Status: He is alert and oriented to person, place, and time. Mental status is at baseline.  Psychiatric:        Mood and Affect: Mood normal.        Behavior: Behavior normal.        Thought Content: Thought content normal.        Judgment: Judgment normal.      No results found for any visits on 06/27/22.  Assessment & Plan     Problem List Items Addressed This Visit       Other   Attention deficit hyperactivity disorder (ADHD), predominantly inattentive type - Primary    Chronic, stable PDMP reviewed Continues to use 1-2 tabs/day Well controlled BP, weight, sleep all stable No complaints Continue to recommend 6 month return to clinic appts      Family history of melanoma    No current skin concerns; request for referral to derm. +family hx Plays golf and spends time outside on his property      Return in about 6 months (around  12/28/2022) for chonic disease management.     Leilani Merl, FNP, have reviewed all documentation for this visit. The documentation on 06/27/22 for the exam, diagnosis, procedures, and orders are all accurate and complete.  Jacky Kindle, FNP  [facility] [phone] (phone) [phone] (fax)  [facility]

## 2022-10-02 ENCOUNTER — Other Ambulatory Visit: Payer: Self-pay | Admitting: Family Medicine

## 2022-10-02 ENCOUNTER — Encounter: Payer: Self-pay | Admitting: Family Medicine

## 2022-10-02 MED ORDER — AMPHETAMINE-DEXTROAMPHETAMINE 20 MG PO TABS: 20.0000 mg | ORAL_TABLET | Freq: Two times a day (BID) | ORAL | 0 refills | Status: DC

## 2022-10-03 ENCOUNTER — Ambulatory Visit: Payer: Commercial Managed Care - PPO | Admitting: Family Medicine

## 2022-12-25 ENCOUNTER — Ambulatory Visit: Payer: Commercial Managed Care - PPO | Admitting: Family Medicine

## 2023-02-06 ENCOUNTER — Other Ambulatory Visit: Payer: Self-pay | Admitting: Family Medicine

## 2023-02-06 NOTE — Telephone Encounter (Signed)
 Medication Refill -  Most Recent Primary Care Visit:  Provider: PAYNE, ELISE T  Department: [facility]  Visit Type: OFFICE VISIT  Date: 06/27/2022  Medication: Adderall 20 mg  Has the patient contacted their pharmacy? No (Agent: If no, request that the patient contact the pharmacy for the refill. If patient does not wish to contact the pharmacy document the reason why and proceed with request.) (Agent: If yes, when and what did the pharmacy advise?)  Is this the correct pharmacy for this prescription? no If no, delete pharmacy and type the correct one.  This is the patient's preferred pharmacy:   [facility] [address] [place]  Has the prescription been filled recently? Yes  Is the patient out of the medication? No  Has the patient been seen for an appointment in the last year OR does the patient have an upcoming appointment? Yes  Can we respond through MyChart? No  Agent: Please be advised that Rx refills may take up to 3 business days. We ask that you follow-up with your pharmacy.

## 2023-02-10 ENCOUNTER — Telehealth: Payer: Self-pay | Admitting: Family Medicine

## 2023-02-10 NOTE — Telephone Encounter (Signed)
 Requested medications are due for refill today.  yes  Requested medications are on the active medications list.  yes  Last refill. 12/01/2022   Future visit scheduled.   no  Notes to clinic.  Pt of Mario Armstrong. Rx wriiten to expire 12/31/2022. Refill/refusal not delegated.    Requested Prescriptions  Pending Prescriptions Disp Refills   amphetamine -dextroamphetamine  (ADDERALL) 20 MG tablet 60 tablet 0    Sig: Take 1 tablet (20 mg total) by mouth 2 (two) times daily. Please make an office visit for further refills.     Not Delegated - Psychiatry:  Stimulants/ADHD Failed - 02/10/2023 11:48 AM      Failed - This refill cannot be delegated      Failed - Urine Drug Screen completed in last 360 days      Failed - Valid encounter within last 6 months    Recent Outpatient Visits           7 months ago Attention deficit hyperactivity disorder (ADHD), predominantly inattentive type   [facility] Mario T, FNP   1 year ago Attention deficit hyperactivity disorder (ADHD), predominantly inattentive type   [facility] Armstrong Mario T, FNP   1 year ago Attention deficit hyperactivity disorder (ADHD), predominantly inattentive type   [facility] Armstrong Mario T, FNP   2 years ago Attention deficit hyperactivity disorder (ADHD), predominantly inattentive type   [facility] Armstrong Mario T, FNP   2 years ago Dog bite, initial encounter   [facility] Vivienne Nest M, [place]              Passed - Last BP in normal range    BP Readings from Last 1 Encounters:  06/27/22 115/75         Passed - Last Heart Rate in normal range    Pulse Readings from Last 1 Encounters:  10/03/21 69

## 2023-02-10 NOTE — Telephone Encounter (Signed)
 Patient is due for appointment. Please see mychart message from 10/02/2022

## 2023-02-10 NOTE — Telephone Encounter (Signed)
 Patient called to f/u on refill request for  amphetamine-dextroamphetamine (ADDERALL) 20 MG tablet

## 2023-02-11 NOTE — Telephone Encounter (Signed)
 Pt lives in [place] and does not have a primary care physician.  He had to wait for insurance to see who he could see.  He wants to know if he can get 30 days.  CB#  [phone]

## 2023-02-12 ENCOUNTER — Other Ambulatory Visit: Payer: Self-pay | Admitting: Family Medicine

## 2023-02-12 ENCOUNTER — Encounter: Payer: Self-pay | Admitting: Family Medicine

## 2023-02-12 MED ORDER — AMPHETAMINE-DEXTROAMPHETAMINE 20 MG PO TABS: 20.0000 mg | ORAL_TABLET | Freq: Two times a day (BID) | ORAL | 0 refills | Status: AC
